# Patient Record
Sex: Female | Born: 1995 | Race: White | Hispanic: No | Marital: Single | Smoking: Never smoker
Health system: Southern US, Community
[De-identification: ages and names within clinical notes are randomized; demographics above are authoritative.]

## PROBLEM LIST (undated history)

## (undated) HISTORY — PX: MUSCLE BIOPSY: SHX716

---

## 2013-01-12 ENCOUNTER — Emergency Department: Payer: Self-pay | Admitting: Emergency Medicine

## 2013-01-12 LAB — CBC WITH DIFFERENTIAL/PLATELET
Eosinophil #: 0 10*3/uL (ref 0.0–0.7)
Lymphocyte #: 1.3 10*3/uL (ref 1.0–3.6)
Lymphocyte %: 9.3 %
MCV: 88 fL (ref 80–100)
Neutrophil %: 79.4 %
RDW: 12.1 % (ref 11.5–14.5)
WBC: 13.7 10*3/uL — ABNORMAL HIGH (ref 3.6–11.0)

## 2013-01-12 LAB — BASIC METABOLIC PANEL
Calcium, Total: 9.6 mg/dL (ref 9.0–10.7)
Chloride: 100 mmol/L (ref 98–107)
Co2: 27 mmol/L (ref 21–32)
EGFR (Non-African Amer.): >60
Glucose: 110 mg/dL — ABNORMAL HIGH (ref 65–99)
Potassium: 3.8 mmol/L (ref 3.5–5.1)

## 2013-01-12 LAB — MONONUCLEOSIS SCREEN: Mono Test: POSITIVE

## 2013-01-15 ENCOUNTER — Emergency Department: Payer: Self-pay | Admitting: Emergency Medicine

## 2013-01-15 LAB — CBC WITH DIFFERENTIAL/PLATELET
Eosinophil #: 0 10*3/uL (ref 0.0–0.7)
Eosinophil %: 0.2 %
HCT: 37.5 % (ref 35.0–47.0)
HGB: 13 g/dL (ref 12.0–16.0)
Lymphocyte #: 2 10*3/uL (ref 1.0–3.6)
Lymphocyte %: 24.1 %
MCHC: 34.6 g/dL (ref 32.0–36.0)
Monocyte #: 1.1 x10 3/mm — ABNORMAL HIGH (ref 0.2–0.9)
Monocyte %: 13.2 %
Neutrophil #: 5.3 10*3/uL (ref 1.4–6.5)
Neutrophil %: 62.3 %
Platelet: 168 10*3/uL (ref 150–440)
RDW: 12 % (ref 11.5–14.5)
WBC: 8.5 10*3/uL (ref 3.6–11.0)

## 2013-04-11 ENCOUNTER — Ambulatory Visit (INDEPENDENT_AMBULATORY_CARE_PROVIDER_SITE_OTHER): Payer: BC Managed Care – PPO | Admitting: Podiatry

## 2013-04-11 ENCOUNTER — Encounter: Payer: Self-pay | Admitting: Podiatry

## 2013-04-11 VITALS — BP 110/81 | HR 60 | Resp 16 | Ht 67.0 in | Wt 160.0 lb

## 2013-04-11 DIAGNOSIS — B351 Tinea unguium: Secondary | ICD-10-CM

## 2013-04-11 NOTE — Progress Notes (Signed)
   Subjective:    Patient ID: Toni Buchanan, female    DOB: 04-06-1995, 18 y.o.   MRN: [id number]  HPI Comments: Both great toeanails are discolored , and not sure if it is a fungus or stained from nail polish, sometimes at night i have to take the socks off my feet cause my toes are painful      Review of Systems  HENT:       Cough   All other systems reviewed and are negative.       Objective:   Physical Exam        Assessment & Plan:

## 2013-04-11 NOTE — Progress Notes (Signed)
Subjective:     Patient ID: Toni Buchanan, female   DOB: 08-23-1995, 18 y.o.   MRN: [id number]  HPI patient is found to have nail disease occurring after a pedicure and states her mother has had a history of fungus toenails and she is concerned   Review of Systems  All other systems reviewed and are negative.       Objective:   Physical Exam  Nursing note and vitals reviewed. Constitutional: She is oriented to person, place, and time.  Cardiovascular: Intact distal pulses.   Musculoskeletal: Normal range of motion.  Neurological: She is oriented to person, place, and time.  Skin: Skin is warm.   neurovascular status intact with no health history changes noted and nail disease mostly in the big toenails and slightly in the lesser nails of both feet     Assessment:     Possible mycotic nail infection versus trauma versus possible irritation from pedicure    Plan:     H&P done and today I recommended formulas 3 to try to reduce fungal element. Reappoint if symptoms continue to persist for consideration of oral therapy

## 2013-08-16 ENCOUNTER — Emergency Department: Payer: Self-pay | Admitting: Internal Medicine

## 2013-09-26 ENCOUNTER — Ambulatory Visit (INDEPENDENT_AMBULATORY_CARE_PROVIDER_SITE_OTHER): Payer: BC Managed Care – PPO | Admitting: Podiatry

## 2013-09-26 VITALS — BP 122/77 | HR 66 | Resp 16

## 2013-09-26 DIAGNOSIS — L600 Ingrowing nail: Secondary | ICD-10-CM

## 2013-09-26 DIAGNOSIS — B351 Tinea unguium: Secondary | ICD-10-CM

## 2013-09-26 DIAGNOSIS — Z79899 Other long term (current) drug therapy: Secondary | ICD-10-CM

## 2013-09-26 MED ORDER — TERBINAFINE HCL 250 MG PO TABS: 250.0000 mg | ORAL_TABLET | Freq: Every day | ORAL | Status: DC

## 2013-09-26 NOTE — Patient Instructions (Signed)

## 2013-09-27 LAB — HEPATIC FUNCTION PANEL
ALT: 10 IU/L (ref 0–32)
AST: 14 IU/L (ref 0–40)
Albumin: 4.6 g/dL (ref 3.5–5.5)
Alkaline Phosphatase: 66 IU/L (ref 39–117)
BILIRUBIN DIRECT: 0.09 mg/dL (ref 0.00–0.40)
Total Bilirubin: 0.3 mg/dL (ref 0.0–1.2)
Total Protein: 7.3 g/dL (ref 6.0–8.5)

## 2013-09-27 NOTE — Progress Notes (Signed)
Subjective:     Patient ID: Toni Buchanan, female   DOB: [birth year], 18 y.o.   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 Also today she is sent for blood work

## 2013-10-14 ENCOUNTER — Ambulatory Visit (INDEPENDENT_AMBULATORY_CARE_PROVIDER_SITE_OTHER): Payer: BC Managed Care – PPO | Admitting: Podiatry

## 2013-10-14 VITALS — BP 131/90 | HR 88 | Resp 16

## 2013-10-14 DIAGNOSIS — B351 Tinea unguium: Secondary | ICD-10-CM

## 2013-10-14 NOTE — Progress Notes (Signed)
Subjective:     Patient ID: Toni Buchanan, female   DOB: 11-07-1995, 18 y.o.   MRN: [id number]  HPI patient presents stating I was just concerned about my big toe and wanted it looked at   Review of Systems     Objective:   Physical Exam Neurovascular status intact with well-healing nail site right hallux with no drainage and crusting occur    Assessment:     Normal appearance of nail bed after removal    Plan:     Instructed on continuation of soaks bandage usage during day and allowing it to air dry a night with complete air drying within the next 5-7 days

## 2013-11-29 ENCOUNTER — Emergency Department: Payer: Self-pay | Admitting: Student

## 2013-12-26 ENCOUNTER — Ambulatory Visit: Payer: BC Managed Care – PPO | Admitting: Podiatry

## 2014-12-08 ENCOUNTER — Emergency Department: Payer: BLUE CROSS/BLUE SHIELD

## 2014-12-08 ENCOUNTER — Inpatient Hospital Stay: Payer: BLUE CROSS/BLUE SHIELD

## 2014-12-08 ENCOUNTER — Inpatient Hospital Stay
Admission: EM | Admit: 2014-12-08 | Discharge: 2014-12-12 | DRG: 493 | Disposition: A | Discharge status: Home / Self Care | Payer: BLUE CROSS/BLUE SHIELD | Attending: Orthopedic Surgery | Admitting: Orthopedic Surgery

## 2014-12-08 DIAGNOSIS — S83015A Lateral dislocation of left patella, initial encounter: Secondary | ICD-10-CM | POA: Diagnosis present

## 2014-12-08 DIAGNOSIS — Y999 Unspecified external cause status: Secondary | ICD-10-CM

## 2014-12-08 DIAGNOSIS — Y92214 College as the place of occurrence of the external cause: Secondary | ICD-10-CM

## 2014-12-08 DIAGNOSIS — S8252XA Displaced fracture of medial malleolus of left tibia, initial encounter for closed fracture: Secondary | ICD-10-CM

## 2014-12-08 DIAGNOSIS — W010XXA Fall on same level from slipping, tripping and stumbling without subsequent striking against object, initial encounter: Secondary | ICD-10-CM | POA: Diagnosis present

## 2014-12-08 DIAGNOSIS — N390 Urinary tract infection, site not specified: Secondary | ICD-10-CM | POA: Diagnosis present

## 2014-12-08 DIAGNOSIS — S82852A Displaced trimalleolar fracture of left lower leg, initial encounter for closed fracture: Secondary | ICD-10-CM | POA: Diagnosis present

## 2014-12-08 DIAGNOSIS — S82392A Other fracture of lower end of left tibia, initial encounter for closed fracture: Secondary | ICD-10-CM

## 2014-12-08 DIAGNOSIS — S82892A Other fracture of left lower leg, initial encounter for closed fracture: Secondary | ICD-10-CM | POA: Diagnosis present

## 2014-12-08 DIAGNOSIS — S82402A Unspecified fracture of shaft of left fibula, initial encounter for closed fracture: Secondary | ICD-10-CM

## 2014-12-08 LAB — URINALYSIS COMPLETE WITH MICROSCOPIC (ARMC ONLY)
Bilirubin Urine: NEGATIVE
GLUCOSE, UA: NEGATIVE mg/dL
NITRITE: NEGATIVE
PROTEIN: 30 mg/dL — AB
SPECIFIC GRAVITY, URINE: 1.027 (ref 1.005–1.030)
pH: 5 (ref 5.0–8.0)

## 2014-12-08 LAB — PREGNANCY, URINE: PREG TEST UR: NEGATIVE

## 2014-12-08 MED ORDER — MORPHINE SULFATE (PF) 2 MG/ML IV SOLN: 2.0000 mg | INTRAVENOUS | Status: DC | PRN

## 2014-12-08 MED ORDER — SODIUM CHLORIDE 0.9 % IV SOLN
INTRAVENOUS | Status: DC
  Administered 2014-12-08 – 2014-12-09 (×2): via INTRAVENOUS

## 2014-12-08 MED ORDER — KETOROLAC TROMETHAMINE 30 MG/ML IJ SOLN
30.0000 mg | Freq: Once | INTRAMUSCULAR | Status: AC
  Administered 2014-12-08: 30 mg via INTRAVENOUS
  Filled 2014-12-08: qty 1

## 2014-12-08 MED ORDER — SULFAMETHOXAZOLE-TRIMETHOPRIM 400-80 MG PO TABS
1.0000 | ORAL_TABLET | Freq: Two times a day (BID) | ORAL | Status: DC
  Administered 2014-12-08: 1 via ORAL
  Filled 2014-12-08 (×3): qty 1

## 2014-12-08 MED ORDER — OXYCODONE HCL 5 MG/5ML PO SOLN
5.0000 mg | ORAL | Status: DC | PRN
  Administered 2014-12-09: 5 mg via ORAL
  Filled 2014-12-08: qty 5

## 2014-12-08 MED ORDER — ACETAMINOPHEN 160 MG/5ML PO SOLN
500.0000 mg | ORAL | Status: DC | PRN
  Filled 2014-12-08 (×2): qty 20

## 2014-12-08 NOTE — H&P (Signed)
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 Patient states that she slipped on a wet floor in the bathroom. Her left patella, she reports dislocated laterally and then spontaneously relocated after several minutes. Patient has a prior history of patella dislocations. The last one was approximately a year ago. She also had pain in the left ankle after her fall. She was unable to stand or weight-bear on the left lower extremity after her injury. Patient denies other areas of injury. She denies hitting her head or losing consciousness.  History reviewed. No pertinent past medical history. Past Surgical History  Procedure Laterality Date  . Skin biopsy     Social History   Social History  . Marital Status: Single    Spouse Name: N/A  . Number of Children: N/A  . Years of Education: N/A   Social History Main Topics  . Smoking status: Never Smoker   . Smokeless tobacco: Never Used  . Alcohol Use: No  . Drug Use: No  . Sexual Activity: Not Asked   Other Topics Concern  . None   Social History Narrative   No family history on file. Allergies  Allergen Reactions  . 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    . Lamisil [Terbinafine] Rash   Prior to Admission medications   Not on File     Positive ROS: All other systems have been reviewed and were otherwise negative with the exception of those mentioned in the HPI and as above.  Physical Exam: General: Alert, no acute distress Cardiovascular: Regular rate and rhythm, no murmurs rubs or gallops.  No pedal edema Respiratory: Clear to auscultation bilaterally, no wheezes rales or rhonchi. No cyanosis, no use of accessory musculature GI: No organomegaly, abdomen  is soft and non-tender nondistended with positive bowel sounds. Skin: Skin intact, no lesions within the operative field. Neurologic: Sensation intact distally Psychiatric: Patient is competent for consent with normal mood and affect Lymphatic: No cervical lymphadenopathy  MUSCULOSKELETAL: Left knee: Patient's skin is intact. She has an effusion. There is no erythema or ecchymosis. Patient has her knee fully extended on the hospital stretcher. There is a small area of ecchymosis over the anterior knee. patient's thigh and leg compartments are soft and compressible.  Left ankle: Patient has a posterior splint in place. She has intact sensation to light touch in all 5 toes. Patient's toes are well-perfused. There is no obvious deformity seen.  Assessment: #1 dislocation of left patella #2 trimalleolar left ankle fracture, closed   Plan: I reviewed the patient's x-rays of both her left knee and her left ankle. The left knee demonstrates the patella is relocated. There is no acute fracture seen. X-rays of the left ankle reveal a trimalleolar ankle fracture. There is a comminuted fracture of the medial malleolus. There is a fracture with displacement of the lateral malleolus. There is slight lateral talar subluxation the posterior malleolar fragment is nondisplaced and involves less than 20% of the articular surface. I'm recommending open reduction internal fixation for her left trimalleolar ankle fracture. I splinted the details of the surgery to her. I also discussed the risks and benefits of surgery. The risks include but are not limited to infection, bleeding requiring blood transfusion,  nerve or blood vessel injury, ankle stiffness or loss of motion, persistent pain, osteoarthritis, weakness or instability, malunion, nonunion and hardware failure and the need for further surgery including hardware removal. Medical risks include but are not limited to DVT and pulmonary embolism, myocardial infarction,  stroke, pneumonia, respiratory failure and death. Patient understood these risks and wished to proceed. She is being admitted to the orthopedic surgery service. She'll be nothing by mouth after midnight. Labs have been ordered. She'll continue to keep the left lower extremity elevated with ice to help reduce swelling overnight. Patient has discussed this plan with her parents. I offered to call the parents but the patient states they are in agreement with the plan and do not need a phone call.   Juanell Fairly, MD   12/08/2014 7:38 PM

## 2014-12-08 NOTE — ED Notes (Signed)
Dr Martha Clan in with pt. Left ankle splinted with OCl and elevated .

## 2014-12-08 NOTE — ED Provider Notes (Signed)
[facility] Emergency Department Provider Note  ____________________________________________  Time seen: Approximately 4:50 PM  I have reviewed the triage vital signs and the nursing notes.   HISTORY  Chief Complaint Fall; Ankle Pain; and Knee Pain   HPI Toni Buchanan is a 19 y.o. female who presents for evaluation of left ankle pain status post fall prior to arrival. Patient states that she slipped on a wet floor at schools bathroom. She reports that her left knee popped out of place but popped [place] in while she was [facility].  History reviewed. No pertinent past medical history.  There are no active problems to display for this patient.   Past Surgical History  Procedure Laterality Date  . Skin biopsy      Current Outpatient Rx  Name  Route  Sig  Dispense  Refill  . OVER THE COUNTER MEDICATION      Formula 3 topical         . terbinafine (LAMISIL) 250 MG tablet   Oral   Take 1 tablet (250 mg total) by mouth daily.   90 tablet   0     Allergies Lamisil  No family history on file.  Social History Social History  Substance Use Topics  . Smoking status: Never Smoker   . Smokeless tobacco: Never Used  . Alcohol Use: No    Review of Systems Constitutional: No fever/chills Eyes: No visual changes. ENT: No sore throat. Cardiovascular: Denies chest pain. Respiratory: Denies shortness of breath. Gastrointestinal: No abdominal pain.  No nausea, no vomiting.  No diarrhea.  No constipation. Genitourinary: Negative for dysuria. Musculoskeletal: Positive for left ankle and knee pain. Skin: Negative for rash. Neurological: Negative for headaches, focal weakness or numbness.  10-point ROS otherwise negative.  ____________________________________________   PHYSICAL EXAM:  VITAL SIGNS: ED Triage Vitals  Enc Vitals Group     BP 12/08/14 1618 127/80 mmHg     Pulse Rate 12/08/14 1618 103     Resp 12/08/14  1618 18     Temp 12/08/14 1618 98 F (36.7 C)     Temp Source 12/08/14 1618 Oral     SpO2 12/08/14 1618 99 %     Weight 12/08/14 1618 180 lb (81.647 kg)     Height 12/08/14 1618  (1.727 m)     Head Cir --      Peak Flow --      Pain Score 12/08/14 1619 9     Pain Loc --      Pain Edu? --      Excl. in GC? --     Constitutional: Alert and oriented. Well appearing and in no acute distress. Cardiovascular: Normal rate, regular rhythm. Grossly normal heart sounds.  Good peripheral circulation. Respiratory: Normal respiratory effort.  No retractions. Lungs CTAB. Musculoskeletal: No lower extremity tenderness nor edema.  No joint effusions. Neurologic:  Normal speech and language. No gross focal neurologic deficits are appreciated. No gait instability. Skin:  Skin is warm, dry and intact. No rash noted. Psychiatric: Mood and affect are normal. Speech and behavior are normal.  ____________________________________________   LABS (all labs ordered are listed, but only abnormal results are displayed)  Labs Reviewed - No data to display  RADIOLOGY  FINDINGS: Moderately displaced oblique fracture is seen involving the distal left fibula. Mildly displaced and comminuted fracture is seen involving the medial malleolus. Mild lateral dislocation of the talus relative to distal tibia is  noted. Mildly displaced posterior malleolar fracture is noted as well. These fractures appear to be closed and posttraumatic.  IMPRESSION: Fractures are seen involving the distal left fibula, medial malleolus and posterior malleolus. Mild lateral dislocation of talus relative to distal tibia is noted. ____________________________________________   PROCEDURES  Procedure(s) performed: None  Critical Care performed: No  ____________________________________________   INITIAL IMPRESSION / ASSESSMENT AND PLAN / ED COURSE  Pertinent labs & imaging results that were available during my care of  the patient were reviewed by me and considered in my medical decision making (see chart for details).  Distal left fibula, posterior and medial malleolus fracture with dislocation of the talus. Spoke with Dr. Martha Clan, orthopedic on call. Patient to be admitted with surgery scheduled for tomorrow morning. ____________________________________________   FINAL CLINICAL IMPRESSION(S) / ED DIAGNOSES  Final diagnoses:  Fibula fracture, left, closed, initial encounter  Fracture, posterior malleolus, left, closed, initial encounter  Fractured medial malleolus, left, closed, initial encounter      Evangeline Dakin, PA-C 12/08/14 1813  Gayla Doss, MD 12/08/14 2033

## 2014-12-08 NOTE — ED Notes (Signed)
Pt states she slipped and fell injurying her left knee and ankle today.Marland Kitchen

## 2014-12-08 NOTE — Progress Notes (Signed)
Dr. Martha Clan notified of urinalysis results. Bactrim bid ordered. Urine culture ordered.

## 2014-12-09 ENCOUNTER — Inpatient Hospital Stay: Payer: BLUE CROSS/BLUE SHIELD

## 2014-12-09 ENCOUNTER — Inpatient Hospital Stay: Payer: BLUE CROSS/BLUE SHIELD | Admitting: Anesthesiology

## 2014-12-09 ENCOUNTER — Encounter
Admission: EM | Disposition: A | Discharge status: Home / Self Care | Payer: Self-pay | Source: Home / Self Care | Attending: Orthopedic Surgery

## 2014-12-09 HISTORY — PX: ORIF ANKLE FRACTURE: SHX5408

## 2014-12-09 LAB — TYPE AND SCREEN
ABO/RH(D): A NEG
ANTIBODY SCREEN: NEGATIVE

## 2014-12-09 LAB — BASIC METABOLIC PANEL
Anion gap: 4 — ABNORMAL LOW (ref 5–15)
BUN: 15 mg/dL (ref 6–20)
CHLORIDE: 109 mmol/L (ref 101–111)
CO2: 26 mmol/L (ref 22–32)
CREATININE: 0.75 mg/dL (ref 0.44–1.00)
Calcium: 8.6 mg/dL — ABNORMAL LOW (ref 8.9–10.3)
GFR calc non Af Amer: >60 mL/min (ref >60)
GLUCOSE: 97 mg/dL (ref 65–99)
Potassium: 3.6 mmol/L (ref 3.5–5.1)
Sodium: 139 mmol/L (ref 135–145)

## 2014-12-09 LAB — CBC
HEMATOCRIT: 33.5 % — AB (ref 35.0–47.0)
HEMOGLOBIN: 11.5 g/dL — AB (ref 12.0–16.0)
MCH: 30.3 pg (ref 26.0–34.0)
MCHC: 34.4 g/dL (ref 32.0–36.0)
MCV: 88 fL (ref 80.0–100.0)
Platelets: 184 10*3/uL (ref 150–440)
RBC: 3.81 MIL/uL (ref 3.80–5.20)
RDW: 12.4 % (ref 11.5–14.5)
WBC: 5.1 10*3/uL (ref 3.6–11.0)

## 2014-12-09 LAB — MRSA PCR SCREENING: MRSA by PCR: NEGATIVE

## 2014-12-09 LAB — ABO/RH: ABO/RH(D): A NEG

## 2014-12-09 SURGERY — OPEN REDUCTION INTERNAL FIXATION (ORIF) ANKLE FRACTURE
Anesthesia: General | Laterality: Left | Wound class: Clean

## 2014-12-09 MED ORDER — FENTANYL CITRATE (PF) 100 MCG/2ML IJ SOLN: 25.0000 ug | INTRAMUSCULAR | Status: DC | PRN

## 2014-12-09 MED ORDER — LACTATED RINGERS IV SOLN
INTRAVENOUS | Status: DC | PRN
  Administered 2014-12-09: 14:00:00 via INTRAVENOUS

## 2014-12-09 MED ORDER — ACETAMINOPHEN 650 MG RE SUPP: 650.0000 mg | Freq: Four times a day (QID) | RECTAL | Status: DC | PRN

## 2014-12-09 MED ORDER — FENTANYL CITRATE (PF) 100 MCG/2ML IJ SOLN
INTRAMUSCULAR | Status: DC | PRN
  Administered 2014-12-09 (×2): 50 ug via INTRAVENOUS

## 2014-12-09 MED ORDER — MAGNESIUM CITRATE PO SOLN: 1.0000 | Freq: Once | ORAL | Status: DC | PRN

## 2014-12-09 MED ORDER — ACETAMINOPHEN 325 MG PO TABS: 650.0000 mg | ORAL_TABLET | Freq: Four times a day (QID) | ORAL | Status: DC | PRN

## 2014-12-09 MED ORDER — BUPIVACAINE HCL (PF) 0.5 % IJ SOLN
INTRAMUSCULAR | Status: DC | PRN
  Administered 2014-12-09: 3 mL

## 2014-12-09 MED ORDER — CEFAZOLIN SODIUM-DEXTROSE 2-3 GM-% IV SOLR
INTRAVENOUS | Status: DC | PRN
  Administered 2014-12-09: 2 g via INTRAVENOUS

## 2014-12-09 MED ORDER — PROPOFOL 500 MG/50ML IV EMUL
INTRAVENOUS | Status: DC | PRN
  Administered 2014-12-09: 75 ug/kg/min via INTRAVENOUS

## 2014-12-09 MED ORDER — ONDANSETRON HCL 4 MG PO TABS: 4.0000 mg | ORAL_TABLET | Freq: Four times a day (QID) | ORAL | Status: DC | PRN

## 2014-12-09 MED ORDER — BISACODYL 10 MG RE SUPP: 10.0000 mg | Freq: Every day | RECTAL | Status: DC | PRN

## 2014-12-09 MED ORDER — MIDAZOLAM HCL 5 MG/5ML IJ SOLN
INTRAMUSCULAR | Status: DC | PRN
  Administered 2014-12-09 (×4): 1 mg via INTRAVENOUS

## 2014-12-09 MED ORDER — ONDANSETRON HCL 4 MG/2ML IJ SOLN: 4.0000 mg | Freq: Four times a day (QID) | INTRAMUSCULAR | Status: DC | PRN

## 2014-12-09 MED ORDER — SODIUM CHLORIDE 0.9 % IV SOLN
INTRAVENOUS | Status: DC
  Administered 2014-12-09 – 2014-12-10 (×2): via INTRAVENOUS

## 2014-12-09 MED ORDER — BUPIVACAINE HCL 0.25 % IJ SOLN
INTRAMUSCULAR | Status: DC | PRN
  Administered 2014-12-09: 30 mL

## 2014-12-09 MED ORDER — CEFAZOLIN SODIUM 1-5 GM-% IV SOLN
1.0000 g | Freq: Four times a day (QID) | INTRAVENOUS | Status: AC
  Administered 2014-12-09 – 2014-12-10 (×3): 1 g via INTRAVENOUS
  Filled 2014-12-09 (×3): qty 50

## 2014-12-09 MED ORDER — ONDANSETRON HCL 4 MG/2ML IJ SOLN
INTRAMUSCULAR | Status: DC | PRN
  Administered 2014-12-09: 4 mg via INTRAVENOUS

## 2014-12-09 MED ORDER — ENOXAPARIN SODIUM 30 MG/0.3ML ~~LOC~~ SOLN
30.0000 mg | Freq: Two times a day (BID) | SUBCUTANEOUS | Status: DC
  Administered 2014-12-10 – 2014-12-12 (×5): 30 mg via SUBCUTANEOUS
  Filled 2014-12-09 (×5): qty 0.3

## 2014-12-09 MED ORDER — ACETAMINOPHEN 160 MG/5ML PO SOLN
500.0000 mg | ORAL | Status: DC | PRN
  Filled 2014-12-09: qty 20

## 2014-12-09 MED ORDER — NEOMYCIN-POLYMYXIN B GU 40-200000 IR SOLN
Status: DC | PRN
Start: 2014-12-09 — End: 2014-12-09
  Administered 2014-12-09: 4 mL

## 2014-12-09 MED ORDER — MORPHINE SULFATE (PF) 2 MG/ML IV SOLN
2.0000 mg | INTRAVENOUS | Status: DC | PRN
  Administered 2014-12-09 – 2014-12-10 (×2): 2 mg via INTRAVENOUS
  Filled 2014-12-09 (×2): qty 1

## 2014-12-09 MED ORDER — MAGNESIUM HYDROXIDE 400 MG/5ML PO SUSP
30.0000 mL | Freq: Every day | ORAL | Status: DC | PRN
  Administered 2014-12-11: 30 mL via ORAL
  Filled 2014-12-09: qty 30

## 2014-12-09 MED ORDER — LIDOCAINE HCL (CARDIAC) 20 MG/ML IV SOLN
INTRAVENOUS | Status: DC | PRN
  Administered 2014-12-09: 30 mg via INTRAVENOUS

## 2014-12-09 MED ORDER — OXYCODONE HCL 5 MG/5ML PO SOLN
10.0000 mg | ORAL | Status: DC | PRN
  Administered 2014-12-09 – 2014-12-12 (×10): 10 mg via ORAL
  Filled 2014-12-09 (×11): qty 10

## 2014-12-09 MED ORDER — ONDANSETRON HCL 4 MG/2ML IJ SOLN: 4.0000 mg | Freq: Once | INTRAMUSCULAR | Status: DC | PRN

## 2014-12-09 MED ORDER — SULFAMETHOXAZOLE-TRIMETHOPRIM 400-80 MG PO TABS
1.0000 | ORAL_TABLET | Freq: Two times a day (BID) | ORAL | Status: DC
  Administered 2014-12-09 – 2014-12-12 (×6): 1 via ORAL
  Filled 2014-12-09 (×6): qty 1

## 2014-12-09 SURGICAL SUPPLY — 56 items
BANDAGE ELASTIC 4 CLIP NS LF (GAUZE/BANDAGES/DRESSINGS) ×6 IMPLANT
BIT DRILL 2.5 X LONG (BIT) ×1
BIT DRILL LONG 2.7 (BIT) ×1 IMPLANT
BIT DRILL X LONG 2.5 (BIT) ×1 IMPLANT
BLADE SURG 15 STRL LF DISP TIS (BLADE) ×1 IMPLANT
BLADE SURG 15 STRL SS (BLADE) ×2
BNDG ESMARK 6X12 TAN STRL LF (GAUZE/BANDAGES/DRESSINGS) ×3 IMPLANT
CLOSURE WOUND 1/2 X4 (GAUZE/BANDAGES/DRESSINGS) ×2
DRAPE FLUOR MINI C-ARM 54X84 (DRAPES) ×3 IMPLANT
DRAPE INCISE IOBAN 66X45 STRL (DRAPES) ×3 IMPLANT
DRAPE U-SHAPE 47X51 STRL (DRAPES) ×3 IMPLANT
DRILL BIT LONG 2.7 (BIT) ×3
DRILL BIT X LONG 2.5 (BIT) ×2
DURAPREP 26ML APPLICATOR (WOUND CARE) ×6 IMPLANT
GAUZE PETRO XEROFOAM 1X8 (MISCELLANEOUS) ×3 IMPLANT
GAUZE SPONGE 4X4 12PLY STRL (GAUZE/BANDAGES/DRESSINGS) ×3 IMPLANT
GLOVE BIOGEL PI IND STRL 9 (GLOVE) ×1 IMPLANT
GLOVE BIOGEL PI INDICATOR 9 (GLOVE) ×2
GLOVE SURG 9.0 ORTHO LTXF (GLOVE) ×6 IMPLANT
GOWN STRL REUS TWL 2XL XL LVL4 (GOWN DISPOSABLE) ×3 IMPLANT
GOWN STRL REUS W/ TWL LRG LVL3 (GOWN DISPOSABLE) ×1 IMPLANT
GOWN STRL REUS W/TWL LRG LVL3 (GOWN DISPOSABLE) ×2
GUIDEWIRE THREADED 1.1X150MM (WIRE) ×9 IMPLANT
KIT RM TURNOVER STRD PROC AR (KITS) ×3 IMPLANT
LABEL OR SOLS (LABEL) IMPLANT
NS IRRIG 1000ML POUR BTL (IV SOLUTION) ×3 IMPLANT
PACK EXTREMITY ARMC (MISCELLANEOUS) ×3 IMPLANT
PAD ABD DERMACEA PRESS 5X9 (GAUZE/BANDAGES/DRESSINGS) IMPLANT
PAD CAST CTTN 4X4 STRL (SOFTGOODS) ×3 IMPLANT
PADDING CAST COTTON 4X4 STRL (SOFTGOODS) ×6
PLATE LCP 3.5 1/3 TUB 10HX117 (Plate) ×3 IMPLANT
SCREW CANC FT ST SFS 4X16 (Screw) ×6 IMPLANT
SCREW CANC FT/18 4.0 (Screw) ×6 IMPLANT
SCREW CANN L THRD/40 4.0 (Screw) ×6 IMPLANT
SCREW CORTEX 3.5 12MM (Screw) ×4 IMPLANT
SCREW CORTEX 3.5 14MM (Screw) ×4 IMPLANT
SCREW CORTEX 3.5 16MM (Screw) ×2 IMPLANT
SCREW CORTEX 3.5 18MM (Screw) ×2 IMPLANT
SCREW CORTEX 3.5 20MM (Screw) IMPLANT
SCREW LOCK CORT ST 3.5X12 (Screw) ×2 IMPLANT
SCREW LOCK CORT ST 3.5X14 (Screw) ×2 IMPLANT
SCREW LOCK CORT ST 3.5X16 (Screw) ×1 IMPLANT
SCREW LOCK CORT ST 3.5X18 (Screw) ×1 IMPLANT
SCREW LOCK CORT ST 3.5X20 (Screw) IMPLANT
SPLINT CAST 1 STEP 4X30 (MISCELLANEOUS) ×6 IMPLANT
SPONGE LAP 18X18 5 PK (GAUZE/BANDAGES/DRESSINGS) ×3 IMPLANT
STAPLER SKIN PROX 35W (STAPLE) ×3 IMPLANT
STOCKINETTE STRL 6IN 960660 (GAUZE/BANDAGES/DRESSINGS) ×3 IMPLANT
STRIP CLOSURE SKIN 1/2X4 (GAUZE/BANDAGES/DRESSINGS) ×4 IMPLANT
SUT VIC AB 0 CT2 27 (SUTURE) ×6 IMPLANT
SUT VIC AB 2-0 SH 27 (SUTURE) ×4
SUT VIC AB 2-0 SH 27XBRD (SUTURE) ×2 IMPLANT
SUT VIC AB 3-0 SH 27 (SUTURE) ×4
SUT VIC AB 3-0 SH 27X BRD (SUTURE) ×2 IMPLANT
SYR 30ML LL (SYRINGE) IMPLANT
TAPE MICROPORE 2IN (TAPE) IMPLANT

## 2014-12-09 NOTE — Progress Notes (Signed)
Subjective:  Patient reports pain as mild.  Patient is resting comfortably in bed. She has no complaints. She asked me to speak with her father by phone.  Objective:   VITALS:   Filed Vitals:   12/08/14 1618 12/08/14 2041 12/09/14 0509 12/09/14 0812  BP: 127/80 124/71 116/58 127/74  Pulse: 103 90 95 96  Temp: 98 F (36.7 C) 98 F (36.7 C) 98.3 F (36.8 C) 98.1 F (36.7 C)  TempSrc: Oral Oral Oral Oral  Resp: 18 18 18 18   Height: 5\' 8"  (1.727 m) 5\' 8"  (1.727 m)    Weight: 81.647 kg (180 lb) 83.598 kg (184 lb 4.8 oz)    SpO2: 99% 100% 99% 100%    PHYSICAL EXAM: Left lower extremity: Patient has intact sensation light touch in all 5 toes. Her toes are well-perfused. She is no evidence of compartment syndrome.   LABS  Results for orders placed or performed during the hospital encounter of 12/08/14 (from the past 24 hour(s))  Urinalysis complete, with microscopic ([facility] only)     Status: Abnormal   Collection Time: 12/08/14  7:54 PM  Result Value Ref Range   Color, Urine AMBER (A) YELLOW   APPearance CLOUDY (A) CLEAR   Glucose, UA NEGATIVE NEGATIVE mg/dL   Bilirubin Urine NEGATIVE NEGATIVE   Ketones, ur 1+ (A) NEGATIVE mg/dL   Specific Gravity, Urine 1.027 1.005 - 1.030   Hgb urine dipstick 1+ (A) NEGATIVE   pH 5.0 5.0 - 8.0   Protein, ur 30 (A) NEGATIVE mg/dL   Nitrite NEGATIVE NEGATIVE   Leukocytes, UA 3+ (A) NEGATIVE   RBC / HPF 0-5 0 - 5 RBC/hpf   WBC, UA TOO NUMEROUS TO COUNT 0 - 5 WBC/hpf   Bacteria, UA RARE (A) NONE SEEN   Squamous Epithelial / LPF 6-30 (A) NONE SEEN   Mucous PRESENT   Pregnancy, urine     Status: None   Collection Time: 12/08/14  7:54 PM  Result Value Ref Range   Preg Test, Ur NEGATIVE NEGATIVE  CBC     Status: Abnormal   Collection Time: 12/09/14  6:24 AM  Result Value Ref Range   WBC 5.1 3.6 - 11.0 K/uL   RBC 3.81 3.80 - 5.20 MIL/uL   Hemoglobin 11.5 (L) 12.0 - 16.0 g/dL   HCT 13.0 (L) 86.5 - 78.4 %   MCV 88.0 80.0 - 100.0 fL   MCH  30.3 26.0 - 34.0 pg   MCHC 34.4 32.0 - 36.0 g/dL   RDW 69.6 29.5 - 28.4 %   Platelets 184 150 - 440 K/uL  Basic metabolic panel     Status: Abnormal   Collection Time: 12/09/14  6:24 AM  Result Value Ref Range   Sodium 139 135 - 145 mmol/L   Potassium 3.6 3.5 - 5.1 mmol/L   Chloride 109 101 - 111 mmol/L   CO2 26 22 - 32 mmol/L   Glucose, Bld 97 65 - 99 mg/dL   BUN 15 6 - 20 mg/dL   Creatinine, Ser 1.32 0.44 - 1.00 mg/dL   Calcium 8.6 (L) 8.9 - 10.3 mg/dL   GFR calc non Af Amer >60 >60 mL/min   GFR calc Af Amer >60 >60 mL/min   Anion gap 4 (L) 5 - 15    Dg Ankle Complete Left  12/08/2014   CLINICAL DATA:  Acute left ankle pain and swelling after falling in bathroom. Initial encounter.  EXAM: LEFT ANKLE COMPLETE - 3+ VIEW  COMPARISON:  None.  FINDINGS: Moderately displaced oblique fracture is seen involving the distal left fibula. Mildly displaced and comminuted fracture is seen involving the medial malleolus. Mild lateral dislocation of the talus relative to distal tibia is noted. Mildly displaced posterior malleolar fracture is noted as well. These fractures appear to be closed and posttraumatic.  IMPRESSION: Fractures are seen involving the distal left fibula, medial malleolus and posterior malleolus. Mild lateral dislocation of talus relative to distal tibia is noted.   Electronically Signed   By: Lupita Raider, M.D.   On: 12/08/2014 17:09   Ct Ankle Left Wo Contrast  12/08/2014   CLINICAL DATA:  Status post fall. Lateral dislocation of the patella. Unable to stand after injury. Initial encounter.  EXAM: CT OF THE LEFT ANKLE WITHOUT CONTRAST  CT 3D CT IMAGE RENDERING ON ACQUISITION WORKSTATION  TECHNIQUE: Multidetector CT imaging of the left ankle was performed according to the standard protocol. Multiplanar CT image reconstructions were also generated.  COMPARISON:  Left knee radiographs performed earlier today at 4:50 p.m.  FINDINGS: There is a comminuted trimalleolar fracture, with  mild lateral displacement of the medial malleolar fragment, an oblique fracture through the distal fibular diaphysis, and a minimally displaced posterior malleolar fracture. There is slight medial widening of the ankle mortise, with slight lateral talar tilt.  No additional fractures are seen. The subtalar joint is unremarkable in appearance. The sinus RC is unremarkable. Visualized joint spaces are otherwise preserved.  Mild soft tissue injury is noted about the ankle, adjacent to visualized fractures. The flexor and extensor tendons are grossly unremarkable. The peroneal tendons are within normal limits. The vasculature is not well assessed without contrast. The Achilles tendon remains intact.  3-dimensional CT images were rendered by post-processing of the original CT data at the CT scanner. The 3-dimensional CT images were interpreted, and findings were reported in the accompanying complete CT report for this study.  IMPRESSION: 1. Comminuted trimalleolar fracture, with mild lateral displacement of the medial malleolar fragment, an oblique fracture through the distal fibular diaphysis, and a minimally displaced posterior malleolar fracture. Slight medial widening of the ankle mortise, with slight lateral talar tilt. 2. Mild soft tissue injury noted about the ankle.   Electronically Signed   By: Roanna Raider M.D.   On: 12/08/2014 21:27   Ct 3d Recon At Scanner  12/08/2014   CLINICAL DATA:  Status post fall. Lateral dislocation of the patella. Unable to stand after injury. Initial encounter.  EXAM: CT OF THE LEFT ANKLE WITHOUT CONTRAST  CT 3D CT IMAGE RENDERING ON ACQUISITION WORKSTATION  TECHNIQUE: Multidetector CT imaging of the left ankle was performed according to the standard protocol. Multiplanar CT image reconstructions were also generated.  COMPARISON:  Left knee radiographs performed earlier today at 4:50 p.m.  FINDINGS: There is a comminuted trimalleolar fracture, with mild lateral displacement of  the medial malleolar fragment, an oblique fracture through the distal fibular diaphysis, and a minimally displaced posterior malleolar fracture. There is slight medial widening of the ankle mortise, with slight lateral talar tilt.  No additional fractures are seen. The subtalar joint is unremarkable in appearance. The sinus RC is unremarkable. Visualized joint spaces are otherwise preserved.  Mild soft tissue injury is noted about the ankle, adjacent to visualized fractures. The flexor and extensor tendons are grossly unremarkable. The peroneal tendons are within normal limits. The vasculature is not well assessed without contrast. The Achilles tendon remains intact.  3-dimensional CT images were rendered by post-processing of the  original CT data at the CT scanner. The 3-dimensional CT images were interpreted, and findings were reported in the accompanying complete CT report for this study.  IMPRESSION: 1. Comminuted trimalleolar fracture, with mild lateral displacement of the medial malleolar fragment, an oblique fracture through the distal fibular diaphysis, and a minimally displaced posterior malleolar fracture. Slight medial widening of the ankle mortise, with slight lateral talar tilt. 2. Mild soft tissue injury noted about the ankle.   Electronically Signed   By: Roanna Raider M.D.   On: 12/08/2014 21:27   Dg Knee Complete 4 Views Left  12/08/2014   CLINICAL DATA:  Fall on floor hand bathroom with left knee pain and swelling. Initial encounter.  EXAM: LEFT KNEE - COMPLETE 4+ VIEW  COMPARISON:  Prior CT and radiographs of the left knee dated 08/16/2013  FINDINGS: No evidence of acute fracture or dislocation. No significant joint effusion identified. There is suggestion of lateral joint space narrowing.  IMPRESSION: No acute fracture.   Electronically Signed   By: Irish Lack M.D.   On: 12/08/2014 17:11    Assessment/Plan:     Active Problems:   Trimalleolar fracture of left ankle  Patient  doing well. She continues to elevate her left lower extremity. I spoke with the patient's father by phone. I explained him the details of the surgery. He is coming down to speak with [facility].  He is going to work out logistics of having her on campus and continuing to go to class despite her recent injury. I explained to him that Giulianna will be in the hospital for 1-2 days postop and will work with physical therapy. We'll have a better assessment of her ability to ambulate with crutches or a walker after PT assessment. Her father is making his way down to [place] from [place]. Her sister is coming in this afternoon from [place]. I answered all questions by the patient and her father. She will remain nothing by mouth and her surgery is scheduled for this afternoon.    Juanell Fairly , MD 12/09/2014, 12:13 PM

## 2014-12-09 NOTE — Op Note (Signed)
12/08/2014 - 12/09/2014  5:21 PM  PATIENT:  Toni Buchanan    PRE-OPERATIVE DIAGNOSIS:  ANKLE FRACTURE LEFT  POST-OPERATIVE DIAGNOSIS:  Same  PROCEDURE:  OPEN REDUCTION INTERNAL FIXATION (ORIF) ANKLE FRACTURE  SURGEON:  Thornton Park, MD  ANESTHESIA:   General  PREOPERATIVE INDICATIONS:  Toni Buchanan is a  19 y.o. female with a diagnosis of LEFT TRIAMALLEOLAR ANKLE FRACTURE.  Based on the patient's fracture displacement I recommended open reduction internal fixation.    I discussed the risks and benefits of surgery with the patient. She understands the risks include but are not limited to infection, bleeding requiring blood transfusion, nerve or blood vessel injury, joint stiffness or loss of motion, persistent pain, weakness or instability, osteoarthritis, malunion, nonunion and hardware failure, painful hardware and the need for further surgery. Medical risks include but are not limited to DVT and pulmonary embolism, myocardial infarction, stroke, pneumonia, respiratory failure and death. Patient understood these risks and wished to proceed.   OPERATIVE IMPLANTS: Synthes one third tubular 10 hole plate for lateral malleolus fixation and two 4.0 cannulated screws for medial malleolus fixation  OPERATIVE FINDINGS: Closed trimalleolar left ankle fracture with displacement of the medial and lateral malleoli.  OPERATIVE PROCEDURE:   Patient was met in the preoperative area. Her left leg was marked with my initials and the word "yes" according the hospital's correct site of surgery protocol. She was brought to the operating room where she underwent spinal anesthesia as the patient was at risk for malignant hyperthermia. She was placed supine on the operative table.  A tourniquet was applied to her left thigh. Her lower extremities were prepped and draped in a sterile fashion. A timeout was performed to verify the patient's name, date of birth, medical record number, correct site of surgery and  correct procedure to be performed. The timeout was also used to verify the patient received antibiotics appropriate instruments, implants and radiographic studies were available in the room. Once all in attendance were in agreement case began. Patient was seen 2 g of capsule prior to the onset of the case.  The left lower extremity was exsanguinated with an Esmarch. The tourniquet was up inflated to 275 mmHg. This was applied for a total of 119 minutes. A lateral incision was made over the fibula. The subcutaneous tissues were dissected with the Metzenbaum scissor and pickup. Careful was taken to avoid injury to the superficial peroneal nerve. The fracture was then identified. It was irrigated and fracture hematoma was removed. A fracture reduction clamp was used to reduce the fracture to an anatomic position. A single lag screw placed perpendicular across the fracture site was then inserted. This is a 18 mm bicortical screw. A 10 hole one third tubular plate was then contoured and placed along the lateral fibula. Bicortical screws were placed above the fracture and a single fully threaded cancellus and a single locking screw were placed below the fracture. The fracture reduction and hardware placement were confirmed on AP and lateral imaging.  Once the lateral malleolus was plated, the attention was turned to the medial ankle. A small vertical incision was made over the tip of the medial malleolus. 2 threaded K wires for the 4.0 cannulated screws were advanced through the tip of the medial malleolus across the fracture site and into the distal tibia. Position of these K wires were confirmed using AP and lateral FluoroScan imaging. Once the K wires were in adequate position they were measured with a depth gauge and  overdrilled with a cannulated drill. Both screws were measured to be 40 mm in length. These long threaded 40 mm 40 cannulated screws were then advanced into position and tightened by hand.  The  posterior malleolus was then examined under fluoroscopy. It was 20% of the articular surface and was in an anatomic position. The decision was made made not to place an AP screw given its stability and small size.  The syndesmosis was tested by performing a stress test on the left ankle after fixation of the medial and lateral malleoli. Patient demonstrated no diastases between the fibula and tibia and no increase in medial clear space.  The medial and lateral incisions were then copiously irrigated. The subcutaneous tissue was closed 20 Vicryls and the skin approximated staples. A dry sterile dressing was applied along with an AO splint. The patient's ankle was splinted in a neutral position. She was then awoken from anesthesia and transferred to hospital bed and brought to the PACU in stable condition. I scrubbed and present the entire case and all sharp and instrument counts were correct at conclusion the case. I spoke to the patient's sister in the postop consultation room to let her know the case was performed without complication the patient was stable in recovery room.    Timoteo Gaul, MD

## 2014-12-09 NOTE — Anesthesia Preprocedure Evaluation (Addendum)
Anesthesia Evaluation  Patient identified by MRN, date of birth, ID band Patient awake    Reviewed: Allergy & Precautions, NPO status , Patient's Chart, lab work & pertinent test results  History of Anesthesia Complications (+) history of anesthetic complications (unspecified muscle weakness, ?MH risk)  Airway Mallampati: II  TM Distance: >3 FB     Dental  (+) Teeth Intact   Pulmonary neg pulmonary ROS,           Cardiovascular negative cardio ROS       Neuro/Psych  Neuromuscular disease (unspecified muscle weakness)    GI/Hepatic negative GI ROS, Neg liver ROS,   Endo/Other  negative endocrine ROS  Renal/GU negative Renal ROS     Musculoskeletal   Abdominal   Peds  Hematology negative hematology ROS (+)   Anesthesia Other Findings   Reproductive/Obstetrics                            Anesthesia Physical Anesthesia Plan  ASA: II  Anesthesia Plan: General   Post-op Pain Management:    Induction: Intravenous  Airway Management Planned: Nasal Cannula  Additional Equipment:   Intra-op Plan:   Post-operative Plan:   Informed Consent: I have reviewed the patients History and Physical, chart, labs and discussed the procedure including the risks, benefits and alternatives for the proposed anesthesia with the patient or authorized representative who has indicated his/her understanding and acceptance.     Plan Discussed with:   Anesthesia Plan Comments:         Anesthesia Quick Evaluation

## 2014-12-09 NOTE — Progress Notes (Signed)
mrsa pcr SENT

## 2014-12-09 NOTE — Transfer of Care (Signed)
Immediate Anesthesia Transfer of Care Note  Patient: Toni Buchanan  Procedure(s) Performed: Procedure(s): OPEN REDUCTION INTERNAL FIXATION (ORIF) ANKLE FRACTURE (Left)  Patient Location: PACU  Anesthesia Type:Spinal  Level of Consciousness: awake, alert  and oriented  Airway & Oxygen Therapy: Patient Spontanous Breathing and Patient connected to face mask oxygen  Post-op Assessment: Post -op Vital signs reviewed and stable  Post vital signs: Reviewed and stable  Last Vitals:  Filed Vitals:   12/09/14 1709  BP: 103/55  Pulse: 97  Temp: 36.9 C  Resp: 10    Complications: No apparent anesthesia complications

## 2014-12-09 NOTE — Care Management Note (Signed)
Case Management Note  Patient Details  Name: Toni Buchanan MRN: [id number] Date of Birth: 04-25-1995  Subjective/Objective:                  Met with patient to discuss discharge planning. 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 She plans for surgery today to repair fracture. She has no DME available as she was independent with mobility prior to this injury. She states her sister will be coming to stay with her as [facility] will allow her to stay on campus with patient- [name]. She uses [facility] on [address] for Rx [phone].  Action/Plan: List of home health agencies left with patient. Rolling walker requested and delivered by Will with [facility]. Rx needed for walker. RNCM will continue to follow.   Expected Discharge Date:                  Expected Discharge Plan:     In-House Referral:     Discharge planning Services  CM Consult  Post Acute Care Choice:  Durable Medical Equipment, Home Health Choice offered to:  Patient  DME Arranged:  Walker rolling DME Agency:  [place]:    [facility] Agency:     Status of Service:  In process, will continue to follow  Medicare Important Message Given:    Date Medicare IM Given:    Medicare IM give by:    Date Additional Medicare IM Given:    Additional Medicare Important Message give by:     If discussed at [place] of Stay Meetings, dates discussed:    Additional Comments:  Marshell Garfinkel, RN 12/09/2014, 11:01 AM

## 2014-12-09 NOTE — OR Nursing (Signed)
Patient came [place] to OR with personal cell phone. She stated her sister would be arriving shortly and I let her know that when she arrived i would hand off her phone to her. Patient and MD both aware. Cell phone given to sister just before case started.

## 2014-12-09 NOTE — Anesthesia Procedure Notes (Signed)
Spinal Patient location during procedure: OR Start time: 12/09/2014 2:15 PM End time: 12/09/2014 2:36 PM Preanesthetic Checklist Completed: patient identified, site marked, surgical consent, pre-op evaluation, timeout performed, IV checked, risks and benefits discussed and monitors and equipment checked Spinal Block Patient position: sitting Prep: Betadine Patient monitoring: heart rate, continuous pulse ox, blood pressure and cardiac monitor Approach: midline Location: L4-5 Injection technique: single-shot Needle Needle type: Whitacre and Introducer  Needle gauge: 25 G Needle length: 9 cm Needle insertion depth: 5 cm Assessment Sensory level: T8 Additional Notes Negative paresthesia. Negative blood return. Positive free-flowing CSF. Expiration date of kit checked and confirmed. Patient tolerated procedure well, without complications.

## 2014-12-10 ENCOUNTER — Encounter: Payer: Self-pay | Admitting: Orthopedic Surgery

## 2014-12-10 NOTE — Anesthesia Postprocedure Evaluation (Signed)
  Anesthesia Post-op Note  Patient: Toni Buchanan  Procedure(s) Performed: Procedure(s): OPEN REDUCTION INTERNAL FIXATION (ORIF) ANKLE FRACTURE (Left)  Anesthesia type:General  Patient location: PACU  Post pain: Pain level controlled  Post assessment: Post-op Vital signs reviewed, Patient's Cardiovascular Status Stable, Respiratory Function Stable, Patent Airway and No signs of Nausea or vomiting  Post vital signs: Reviewed and stable  Last Vitals:  Filed Vitals:   12/10/14 0818  BP: 122/76  Pulse: 85  Temp: 36.7 C  Resp: 18    Level of consciousness: awake, alert  and patient cooperative  Complications: No apparent anesthesia complications

## 2014-12-10 NOTE — Progress Notes (Signed)
Subjective:  Postop day #1 status post open reduction internal fixation for left trimalleolar ankle fracture. Patient reports pain as moderate.  Patient is seen with her father at the bedside. Patient is up out of bed to a chair with her left leg elevated and ice applied. Patient states that she is reasonably comfortable at the moment. She has been taking oxycodone and morphine for pain.  Objective:   VITALS:   Filed Vitals:   12/09/14 2235 12/10/14 0108 12/10/14 0348 12/10/14 0818  BP: 132/76 135/71 123/65 122/76  Pulse: 102 94 81 85  Temp: 98.9 F (37.2 C) 98.6 F (37 C) 99.1 F (37.3 C) 98.1 F (36.7 C)  TempSrc: Oral Oral Oral Oral  Resp: 18 18 18 18   Height:      Weight:      SpO2: 100% 100% 99% 100%    PHYSICAL EXAM: Left lower extremity: Patient's splint is clean dry and intact. Her toes are well-perfused. She can flex and extend her toes actively without pain. She has no pain with passive stretch. She has intact sensation light touch throughout all toes on the left foot.   LABS  No results found for this or any previous visit (from the past 24 hour(s)).  Dg Ankle Complete Left  12/08/2014   CLINICAL DATA:  Acute left ankle pain and swelling after falling in bathroom. Initial encounter.  EXAM: LEFT ANKLE COMPLETE - 3+ VIEW  COMPARISON:  None.  FINDINGS: Moderately displaced oblique fracture is seen involving the distal left fibula. Mildly displaced and comminuted fracture is seen involving the medial malleolus. Mild lateral dislocation of the talus relative to distal tibia is noted. Mildly displaced posterior malleolar fracture is noted as well. These fractures appear to be closed and posttraumatic.  IMPRESSION: Fractures are seen involving the distal left fibula, medial malleolus and posterior malleolus. Mild lateral dislocation of talus relative to distal tibia is noted.   Electronically Signed   By: Lupita Raider, M.D.   On: 12/08/2014 17:09   Ct Ankle Left Wo  Contrast  12/08/2014   CLINICAL DATA:  Status post fall. Lateral dislocation of the patella. Unable to stand after injury. Initial encounter.  EXAM: CT OF THE LEFT ANKLE WITHOUT CONTRAST  CT 3D CT IMAGE RENDERING ON ACQUISITION WORKSTATION  TECHNIQUE: Multidetector CT imaging of the left ankle was performed according to the standard protocol. Multiplanar CT image reconstructions were also generated.  COMPARISON:  Left knee radiographs performed earlier today at 4:50 p.m.  FINDINGS: There is a comminuted trimalleolar fracture, with mild lateral displacement of the medial malleolar fragment, an oblique fracture through the distal fibular diaphysis, and a minimally displaced posterior malleolar fracture. There is slight medial widening of the ankle mortise, with slight lateral talar tilt.  No additional fractures are seen. The subtalar joint is unremarkable in appearance. The sinus RC is unremarkable. Visualized joint spaces are otherwise preserved.  Mild soft tissue injury is noted about the ankle, adjacent to visualized fractures. The flexor and extensor tendons are grossly unremarkable. The peroneal tendons are within normal limits. The vasculature is not well assessed without contrast. The Achilles tendon remains intact.  3-dimensional CT images were rendered by post-processing of the original CT data at the CT scanner. The 3-dimensional CT images were interpreted, and findings were reported in the accompanying complete CT report for this study.  IMPRESSION: 1. Comminuted trimalleolar fracture, with mild lateral displacement of the medial malleolar fragment, an oblique fracture through the distal fibular diaphysis, and  a minimally displaced posterior malleolar fracture. Slight medial widening of the ankle mortise, with slight lateral talar tilt. 2. Mild soft tissue injury noted about the ankle.   Electronically Signed   By: Roanna Raider M.D.   On: 12/08/2014 21:27   Ct 3d Recon At Scanner  12/08/2014    CLINICAL DATA:  Status post fall. Lateral dislocation of the patella. Unable to stand after injury. Initial encounter.  EXAM: CT OF THE LEFT ANKLE WITHOUT CONTRAST  CT 3D CT IMAGE RENDERING ON ACQUISITION WORKSTATION  TECHNIQUE: Multidetector CT imaging of the left ankle was performed according to the standard protocol. Multiplanar CT image reconstructions were also generated.  COMPARISON:  Left knee radiographs performed earlier today at 4:50 p.m.  FINDINGS: There is a comminuted trimalleolar fracture, with mild lateral displacement of the medial malleolar fragment, an oblique fracture through the distal fibular diaphysis, and a minimally displaced posterior malleolar fracture. There is slight medial widening of the ankle mortise, with slight lateral talar tilt.  No additional fractures are seen. The subtalar joint is unremarkable in appearance. The sinus RC is unremarkable. Visualized joint spaces are otherwise preserved.  Mild soft tissue injury is noted about the ankle, adjacent to visualized fractures. The flexor and extensor tendons are grossly unremarkable. The peroneal tendons are within normal limits. The vasculature is not well assessed without contrast. The Achilles tendon remains intact.  3-dimensional CT images were rendered by post-processing of the original CT data at the CT scanner. The 3-dimensional CT images were interpreted, and findings were reported in the accompanying complete CT report for this study.  IMPRESSION: 1. Comminuted trimalleolar fracture, with mild lateral displacement of the medial malleolar fragment, an oblique fracture through the distal fibular diaphysis, and a minimally displaced posterior malleolar fracture. Slight medial widening of the ankle mortise, with slight lateral talar tilt. 2. Mild soft tissue injury noted about the ankle.   Electronically Signed   By: Roanna Raider M.D.   On: 12/08/2014 21:27   Dg Knee Complete 4 Views Left  12/08/2014   CLINICAL DATA:  Fall on  floor hand bathroom with left knee pain and swelling. Initial encounter.  EXAM: LEFT KNEE - COMPLETE 4+ VIEW  COMPARISON:  Prior CT and radiographs of the left knee dated 08/16/2013  FINDINGS: No evidence of acute fracture or dislocation. No significant joint effusion identified. There is suggestion of lateral joint space narrowing.  IMPRESSION: No acute fracture.   Electronically Signed   By: Irish Lack M.D.   On: 12/08/2014 17:11   Dg Ankle Left Port  12/09/2014   CLINICAL DATA:  19 year old female status post ORIF of trimalleolar left ankle fracture  EXAM: PORTABLE LEFT ANKLE - 2 VIEW  COMPARISON:  Preoperative radiographs 12/08/2014  FINDINGS: Interval ORIF of a bimalleolar fracture with a lateral buttress plate and screw construct transfixing the distal fibular fracture as well as a single inter fragmentary screw. 2 cannulated lag screws transfix the medial malleolus fracture. There is associated soft tissue swelling. Alignment of the fracture fragments has significantly improved. No evidence of immediate hardware complication. The patient is in a fiberglass splint.  IMPRESSION: ORIF of bimalleolar fracture as above without evidence of immediate hardware complication.  Alignment of the fracture fragments is improved.   Electronically Signed   By: Malachy Moan M.D.   On: 12/09/2014 17:47    Assessment/Plan: 1 Day Post-Op   Active Problems:   Trimalleolar fracture of left ankle  Patient is stable postop from an orthopedic  standpoint. Occupational therapy arrived while I was in the room to work with the patient. She was seen by physical therapy this morning. Patient has baseline hypotonia which affects her upper body strength. This will likely affect her ability to use a walker or crutches. She also has a concomitant knee injury following a patellar dislocation during her fall. Her knee pain and effusion will also likely affect her PT. Patient should continue to ice and elevate her left lower  extremity. She will be a minimum of 6 weeks nonweightbearing on the left lower extremity. Patient's father is going to speak with you on [facility] and will explain to them the patient's limitations. Upon discharge we can arrange for home health PT for the patient for safety evaluation and lower extremity strengthening. Patient will follow up with me in 10-14 days after discharge for staple removal and cast application. She would likely benefit from a handicap accessible room on campus and will likely need transportation to get class. Patient has been prescribed a wheelchair with a left leg support as well as a rolling walker. Patient will remain hospitalized for pain control and neurovascular monitoring as well as physical and occupational therapy assessment. Her father will discuss post discharge arrangements with [facility] for [place] before she can be discharged [place] to campus.   Juanell Fairly , MD 12/10/2014, 1:32 PM

## 2014-12-10 NOTE — Evaluation (Signed)
Occupational Therapy Evaluation Patient Details Name: Toni Buchanan MRN: [id number] DOB: 1996-01-25 Today's Date: 12/10/2014    History of Present Illness This patient is a 19 year old female who came to [facility] after a fall in bathroom sustaining L ankle trimalleolar fx with displacement of medial and lateral malleoli. Her L patella also dislocated laterally but spontaneously relocated after several minutes (Dad reports patellar dislocations in the past).  She received a L ankle ORIF 12/09/14. 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   Clinical Impression   This patient is a 19 year old female with the above history. She lives in an apartment with 3 roommates with no adapted devices. She is a Consulting civil engineer at [employer] and had been independent with activities of daily living and functional mobility. She now needs assist and would benefit from Occupational Therapy for ADL/functioal mobility training.      Follow Up Recommendations       Equipment Recommendations       Recommendations for Other Services  (No further OT after dischare, PT to follow through with upper body strength when appropriate.)     Precautions / Restrictions Precautions Precautions: Fall Required Braces or Orthoses: Other Brace/Splint Other Brace/Splint: L ankle splint Restrictions Weight Bearing Restrictions: Yes LLE Weight Bearing: Non weight bearing Other Position/Activity Restrictions: Elevate L LE      Mobility Bed Mobility  Transfers            Balance                              ADL                                         General ADL Comments: Patient had been independent . She now requires assist. Her sister will be bringing some clothing (hopefully big enough to go over the splint) and will try dressing. Suggested getting raised toilet set or commode that goes over toilet and a reacher to reach objects from wheel chair level. Given  list of vendors for reacher.     Vision     Perception     Praxis             Hand Dominance     Extremity/Trunk Assessment Upper Extremity Assessment Upper Extremity Assessment:  (B UE 4-/5 grip R 48 L 35 low upper body endurance.)         Communication Communication Communication: No difficulties   Cognition Arousal/Alertness: Awake/alert Behavior During Therapy: WFL for tasks assessed/performed Overall Cognitive Status: Within Functional Limits for tasks assessed                     General Comments       Exercises       Shoulder Instructions      Home Living Family/patient expects to be discharged to:: Unsure (Dad looking into help on [place] campus) Living Arrangements:  (Appartment with roomates.) Available Help at Discharge: Family;Friend(s) (Family lives in [place]) Type of Home: Apartment Home Access: Level entry     Home Layout: One level         Bathroom Toilet: Standard     Home Equipment: None          Prior Functioning/Environment Level of Independence: Independent  OT Diagnosis: Generalized weakness;Acute pain   OT Problem List: Decreased strength;Decreased range of motion;Decreased activity tolerance;Pain   OT Treatment/Interventions:      OT Goals(Current goals can be found in the care plan section) Acute Rehab OT Goals Patient Stated Goal: To have less L ankle pain OT Goal Formulation: With patient Time For Goal Achievement: 12/24/14 Potential to Achieve Goals: Good  OT Frequency:     Barriers to D/C:            Co-evaluation              End of Session Equipment Utilized During Treatment:  (reacher)  Activity Tolerance:   Patient left:     Time: 1610-9604 OT Time Calculation (min): 25 min Charges:  OT General Charges $OT Visit: 1 Procedure OT Evaluation $Initial OT Evaluation Tier I: 1 Procedure OT Treatments $Self Care/Home Management : 8-22 mins G-Codes:    Gwyndolyn Kaufman, MS/OTR/L  12/10/2014, 2:09 PM

## 2014-12-10 NOTE — Care Management (Addendum)
RX needed for rolling walker, crutches, and standard wheelchair. I met with patient's father Cay Schillings [phone] to discuss discharge planning. Patient is now unsure whether she wants to complete her last semester at [employer] or return at a later time. I am checking on manual wheelchair as recommended by PT. Knee scooter will not be an option at this time per PT due to swelling in involved knee. I am unsure if patient will need Lovenox- MD to advise. I have left message for Whitney at [employer] disability office [phone] to attempt to coordinate discharge plan if patient returns to [facility] at discharge (handicap Lucianne Lei, someone to help her with wheelchair to class, physical therapy, etc.). It will be difficult for patient to drive due to getting in and out of her SUV. Patient walked 15 feet this AM with PT. Father updated on list of home health agencies   Per [facility]: Knee Scooter $80/month and manual wheelchair $30/month- Patient notified.

## 2014-12-10 NOTE — Progress Notes (Signed)
Physical Therapy Treatment Patient Details Name: Toni Buchanan MRN: [id number] DOB: 07-30-1995 Today's Date: 12/10/2014    History of Present Illness Pt is a 19 y.o. female s/p fall in bathroom sustaining L ankle trimalleolar fx with displacement of medial and lateral malleoli; pt's L patella also dislocated laterally but spontaneously relocated after several minutes (pt with h/o patellar dislocations per pt and pt's father).  Pt s/p L ankle ORIF 12/09/14.    PT Comments    Pt demonstrating significant improved L LE pain (1/10) this afternoon and was able to ambulate 100 feet with RW CGA.  Pt did well maintaining L LE NWB'ing status and was steady without loss of balance using RW.  Pt still requires some vc's for RW use but anticipate pt will do well with RW for home use (needs RW for balance).  Will continue to progress pt per pt tolerance.   Follow Up Recommendations   (HHPT vs OP PT)     Equipment Recommendations  Rolling walker with 5" wheels;3in1 (PT) (manual w/c with L LE elevating leg rest; may need shower chair)    Recommendations for Other Services       Precautions / Restrictions Precautions Precautions: Fall Required Braces or Orthoses: Other Brace/Splint Other Brace/Splint: L ankle splint Restrictions Weight Bearing Restrictions: Yes LLE Weight Bearing: Non weight bearing Other Position/Activity Restrictions: Elevate L LE    Mobility  Bed Mobility Overal bed mobility: Needs Assistance Bed Mobility: Sit to Supine     Sit to supine: Supervision;HOB elevated   General bed mobility comments: increased time to perform and used UE's to assist with L LE into bed  Transfers Overall transfer level: Needs assistance Equipment used: Rolling walker (2 wheeled) Transfers: Sit to/from Stand Sit to Stand: Min guard         General transfer comment: vc's required for hand and feet placement and technique  Ambulation/Gait Ambulation/Gait assistance: Min  guard Ambulation Distance (Feet): 100 Feet Assistive device: Rolling walker (2 wheeled)   Gait velocity: decreased   General Gait Details: NWB L LE; decreased R LE step length/foot clearance/heelstrike; vc's required for technique initially; steady without loss of balance   Stairs            Wheelchair Mobility    Modified Rankin (Stroke Patients Only)       Balance Overall balance assessment: Needs assistance Sitting-balance support: Bilateral upper extremity supported;Feet supported Sitting balance-[name]: Good     Standing balance support: Bilateral upper extremity supported (on RW) Standing balance-[name]: Good                      Cognition Arousal/Alertness: Awake/alert Behavior During Therapy: WFL for tasks assessed/performed Overall Cognitive Status: Within Functional Limits for tasks assessed                      Exercises   Performed semi-supine LE therapeutic exercise x 10 reps:  Ankle pumps (AROM R LE); quad sets x3 second holds (AROM B LE's); glute squeezes x3 second holds (AROM B); heelslides (AROM R; AAROM L), hip abd/adduction (AROM R; AAROM L), and SLR (AAROM L LE).  Pt required vc's and tactile cues for correct technique with exercises.     General Comments General comments (skin integrity, edema, etc.): L knee swelling noted  Nursing cleared pt for participation in physical therapy.  Pt agreeable to PT session.      Pertinent Vitals/Pain Pain Assessment: 0-10 Pain Score: 1  Pain Location: L ankle Pain Descriptors / Indicators: Sore;Throbbing Pain Intervention(s): Limited activity within patient's tolerance;Monitored during session;Repositioned;Ice applied (Pt declined pain medication prior to PT session.)  Vitals stable and WFL throughout treatment session.    Home Living Family/patient expects to be discharged to:: Unsure (Dad looking into help on [employer] campus) Living Arrangements:  (Appartment with  roomates.) Available Help at Discharge: Family;Friend(s) (Family lives in [place]) Type of Home: Apartment Home Access: Level entry   Home Layout: One level Home Equipment: None      Prior Function Level of Independence: Independent          PT Goals (current goals can now be found in the care plan section) Acute Rehab PT Goals Patient Stated Goal: To have less L ankle pain PT Goal Formulation: With patient Time For Goal Achievement: 12/24/14 Potential to Achieve Goals: Good Progress towards PT goals: Progressing toward goals    Frequency  BID    PT Plan Current plan remains appropriate    Co-evaluation             End of Session Equipment Utilized During Treatment: Gait belt Activity Tolerance: Patient tolerated treatment well Patient left: with call bell/phone within reach;with bed alarm set;with family/visitor present;with SCD's reapplied (L LE elevated on pillows)     Time: 1500-1540 PT Time Calculation (min) (ACUTE ONLY): 40 min  Charges:  $Gait Training: 8-22 mins $Therapeutic Exercise: 8-22 mins $Therapeutic Activity: 8-22 mins                    G CodesHendricks Limes 14-Dec-2014, 4:28 PM Hendricks Limes, PT [phone]

## 2014-12-10 NOTE — Progress Notes (Signed)
Clinical Child psychotherapist (CSW) received SNF consult. PT is recommending home. RN case manager aware of above. Please reconsult if future social work needs arise. CSW signing off.   Jetta Lout, LCSWA [phone]

## 2014-12-10 NOTE — Evaluation (Signed)
Physical Therapy Evaluation Patient Details Name: Toni Buchanan MRN: [id number] DOB: Apr 15, 1995 Today's Date: 12/10/2014   History of Present Illness  Pt is a 19 y.o. female s/p fall in bathroom sustaining L ankle trimalleolar fx with displacement of medial and lateral malleoli; pt's L patella also dislocated laterally but spontaneously relocated after several minutes (pt with h/o patellar dislocations per pt and pt's father).  Pt s/p L ankle ORIF 12/09/14.  Clinical Impression  Currently pt demonstrates impairments with L LE strength, L LE knee ROM (L ankle immobilized in splint), pain, balance, and limitations with functional mobility.  Prior to admission, pt was independent with functional mobility and going to [facility].  Pt lives in an apt (10-15 minute walk to [facility]) with level entry.  Currently pt is min assist with bed mobility, min assist with transfers, and CGA with ambulation 15 feet with RW.  Pt has baseline hypotonia which affects her strength (per pt's father who reports pt has been seen at other hospitals d/t this).  Pt would benefit from skilled PT to address above noted impairments and functional limitations.  To discharge [facility] to [facility] and be able to go to her classes, anticipate pt would require AD (RW vs axillary crutches) for mobility within the home and manual w/c (with L LE elevating leg rest) in order to attend her classes on campus.  D/t apt distance from campus, pt would require transportation (for pt and manual w/c) to/from campus and anticipate (pending on location of classes and distance/accessibility between classes) pt may require assist (to push w/c or transportation) to get between classes as well.  With pt's L knee swelling/injury, question if pt would be able to tolerate kneeling on knee scooter for longer distances.  Pending on pt's access to transportation, recommend OP PT if able to access (if not, recommend HHPT).    Follow Up Recommendations   (Home Health PT vs OP PT)    Equipment Recommendations  Other (comment) (RW vs axillary crutches pending pt's progress; manual w/c; bedside commode; may need shower chair pending on disposition on discharge)    Recommendations for Other Services       Precautions / Restrictions Precautions Precautions: Fall Required Braces or Orthoses: Other Brace/Splint Other Brace/Splint: L ankle splint Restrictions Weight Bearing Restrictions: Yes LLE Weight Bearing: Non weight bearing Other Position/Activity Restrictions: Elevate L LE      Mobility  Bed Mobility Overal bed mobility: Needs Assistance Bed Mobility: Supine to Sit     Supine to sit: Min assist;HOB elevated     General bed mobility comments: vc's required for technique and assist to scoot to edge of bed  Transfers Overall transfer level: Needs assistance Equipment used: Rolling walker (2 wheeled) Transfers: Sit to/from Stand Sit to Stand: Min assist         General transfer comment: vc's required for hand and feet placement and technique  Ambulation/Gait Ambulation/Gait assistance: Min guard Ambulation Distance (Feet): 15 Feet Assistive device: Rolling walker (2 wheeled)   Gait velocity: decreased   General Gait Details: NWB L LE; decreased R LE step length/foot clearance/heelstrike; vc's required for technique; limited distance d/t pain and fatigue  Stairs            Wheelchair Mobility    Modified Rankin (Stroke Patients Only)       Balance Overall balance assessment: Needs assistance Sitting-balance support: Bilateral upper extremity supported;Feet supported Sitting balance-[name]: Good     Standing balance support: Bilateral upper  extremity supported (on RW) Standing balance-[name]: Good                               Pertinent Vitals/Pain Pain Assessment: 0-10 Pain Score: 6  Pain Location: L ankle Pain Descriptors / Indicators: Sore;Throbbing Pain  Intervention(s): Limited activity within patient's tolerance;Monitored during session;Premedicated before session;Repositioned;Ice applied (L LE elevated on 2 pillows)  Vitals stable and WFL throughout treatment session.    Home Living Family/patient expects to be discharged to:: Unsure Living Arrangements:  (apt close to [facility]) Available Help at Discharge: Family;Friend(s) (Family lives in [place]) Type of Home: Apartment Home Access: Level entry     Home Layout: One level Home Equipment: None      Prior Function Level of Independence: Independent               Hand Dominance        Extremity/Trunk Assessment   Upper Extremity Assessment: Overall WFL for tasks assessed           Lower Extremity Assessment: RLE deficits/detail;LLE deficits/detail RLE Deficits / Details: R LE strength and ROM WFL LLE Deficits / Details: L ankle immobilized in splint; L knee flexion/extension 3-/5; L hip flexion 3-/5; L knee flexion 70 degrees (firm end feel) and L knee extension 15 degrees short of neutral (pt reports her L knee does not straighten baseline)  Cervical / Trunk Assessment: Normal  Communication   Communication: No difficulties  Cognition Arousal/Alertness: Awake/alert Behavior During Therapy: WFL for tasks assessed/performed Overall Cognitive Status: Within Functional Limits for tasks assessed                      General Comments General comments (skin integrity, edema, etc.): L knee swelling noted  Nursing cleared pt for participation in physical therapy.  Pt received pain medication prior to session.  Pt agreeable to PT session.  Pt's father present during session and her sister part of session.     Exercises  Extra time spent discussing with pt and pt's father regarding PT recommendations and options for discharge (including disposition, DME, assist levels required; mobility to attend classes at [facility]).      Assessment/Plan     PT Assessment Patient needs continued PT services  PT Diagnosis Difficulty walking;Acute pain   PT Problem List Decreased strength;Decreased range of motion;Decreased activity tolerance;Decreased balance;Decreased mobility;Decreased knowledge of use of DME;Decreased knowledge of precautions;Impaired sensation;Pain  PT Treatment Interventions DME instruction;Gait training;Stair training;Functional mobility training;Therapeutic activities;Therapeutic exercise;Balance training;Patient/family education;Wheelchair mobility training   PT Goals (Current goals can be found in the Care Plan section) Acute Rehab PT Goals Patient Stated Goal: To have less L ankle pain PT Goal Formulation: With patient Time For Goal Achievement: 12/24/14 Potential to Achieve Goals: Fair    Frequency BID   Barriers to discharge        Co-evaluation               End of Session Equipment Utilized During Treatment: Gait belt Activity Tolerance: Patient limited by pain Patient left: in chair;with call bell/phone within reach;with family/visitor present;with SCD's reapplied (L LE elevated on 2 pillows) Nurse Communication: Mobility status;Precautions;Weight bearing status         Time: 1610-9604 PT Time Calculation (min) (ACUTE ONLY): 65 min   Charges:   PT Evaluation $Initial PT Evaluation Tier I: 1 Procedure PT Treatments $Therapeutic Activity: 23-37 mins   PT G Codes:  Irving Burton Yassmin Binegar 12/10/2014, 1:30 PM Hendricks Limes, PT [phone]

## 2014-12-11 NOTE — Progress Notes (Signed)
Occupational Therapy Treatment Patient Details Name: Toni Buchanan MRN: [id number] DOB: 06/20/1995 Today's Date: 12/11/2014    History of present illness Pt is a 19 y.o. female s/p fall in bathroom sustaining L ankle trimalleolar fx with displacement of medial and lateral malleoli; pt's L patella also dislocated laterally but spontaneously relocated after several minutes (pt with h/o patellar dislocations per pt and pt's father).  Pt s/p L ankle ORIF 12/09/14.   OT comments  Patient understands dressing techniques using reacher.   Follow Up Recommendations  No OT follow up    Equipment Recommendations       Recommendations for Other Services      Precautions / Restrictions Precautions Precautions: Fall Required Braces or Orthoses: Other Brace/Splint Other Brace/Splint: L ankle splint Restrictions Weight Bearing Restrictions: Yes LLE Weight Bearing: Non weight bearing Other Position/Activity Restrictions: Elevate L LE       Mobility Bed Mobility   Transfers          Balance                         ADL  Patient practiced techniques for lower body dressing using reacher (could not reach her left foot with out a reacher). Able to donn pants using reacher.                                              Vision                     Perception     Praxis      Cognition   Behavior During Therapy: WFL for tasks assessed/performed Overall Cognitive Status: Within Functional Limits for tasks assessed                       Extremity/Trunk Assessment               Exercises     Shoulder Instructions       General Comments      Pertinent Vitals/ Pain       Pain Assessment: 0-10 Pain Score: 4  Pain Location: L ankle Pain Descriptors / Indicators: Sore;Tender Pain Intervention(s): Limited activity within patient's tolerance;Monitored during session;Premedicated before session;Repositioned;Ice applied (L LE  elevated on 3 pillows)  Home Living Family/patient expects to be discharged to:: Private residence (home in [place].)   Available Help at Discharge: Family;Friend(s) Type of Home: Apartment                                  Prior Functioning/Environment              Frequency       Progress Toward Goals  OT Goals(current goals can now be found in the care plan section)     Acute Rehab OT Goals Patient Stated Goal: To have less L ankle pain  Plan      Co-evaluation                 End of Session Equipment Utilized During Treatment:  (reacher)   Activity Tolerance     Patient Left     Nurse Communication          Time: 6387-5643 OT Time Calculation (min): 15 min  Charges: OT General Charges $OT Visit: 1 Procedure OT Treatments $Self Care/Home Management : 8-22 mins  Elyn Peers M 12/11/2014, 11:24 AM

## 2014-12-11 NOTE — Progress Notes (Signed)
Physical Therapy Treatment Patient Details Name: Toni Buchanan MRN: [id number] DOB: [birth year] Today's Date: 12/11/2014    History of Present Illness Pt is a 19 y.o. female s/p fall in bathroom sustaining L ankle trimalleolar fx with displacement of medial and lateral malleoli; pt's L patella also dislocated laterally but spontaneously relocated after several minutes (pt with h/o patellar dislocations per pt and pt's father).  Pt s/p L ankle ORIF 12/09/14.    PT Comments    Pt able to progress ambulation distance with RW this morning and appeared steady with RW and able to maintain NWB'ing L LE status.  Pt educated on how to safely use and navigate manual w/c.  Pt's father declined trialing stairs today (reports he has a set-up already figured out to get up the steps) and also reports pt will perform sponge-baths at home d/t no shower on main level.  Pt's father reports plan to discharge Toni Buchanan to their home in [place] instead of going Toni Buchanan to [employer].   Follow Up Recommendations   (HHPT vs OPPT)     Equipment Recommendations  Rolling walker with 5" wheels;3in1 (PT) (manual w/c with L LE elevating legrest)    Recommendations for Other Services       Precautions / Restrictions Precautions Precautions: Fall Required Braces or Orthoses: Other Brace/Splint Other Brace/Splint: L ankle splint Restrictions Weight Bearing Restrictions: Yes LLE Weight Bearing: Non weight bearing Other Position/Activity Restrictions: Elevate L LE    Mobility  Bed Mobility Overal bed mobility: Needs Assistance Bed Mobility: Supine to Sit     Supine to sit: Supervision     General bed mobility comments: increased time to perform but able to move L LE out of bed without use of UE's; bed flat  Transfers Overall transfer level: Needs assistance Equipment used: Rolling walker (2 wheeled);None Transfers: Sit to/from [employer] to Stand: Supervision (with RW) Stand pivot  transfers: Min guard (bed to w/c and w/c to recliner transfer no AD)       General transfer comment: vc's required  initially for hand and feet placement and technique with stand pivot transfers without AD  Ambulation/Gait Ambulation/Gait assistance: Supervision Ambulation Distance (Feet): 160 Feet Assistive device: Rolling walker (2 wheeled)       General Gait Details: NWB L LE; decreased R LE step length/foot clearance/heelstrike; steady without loss of balance   Stairs            Wheelchair Mobility  Pt propelled w/c with UE's 90 feet with SBA; vc's and assist required initially for brakes, w/c set-up for transfers, and navigating tight spaces and turns but pt improved with practice/repetition.  Modified Rankin (Stroke Patients Only)       Balance Overall balance assessment: Needs assistance Sitting-balance support: No upper extremity supported Sitting balance-[name]: Normal     Standing balance support: Bilateral upper extremity supported (on RW) Standing balance-[name]: Good                      Cognition Arousal/Alertness: Awake/alert Behavior During Therapy: WFL for tasks assessed/performed Overall Cognitive Status: Within Functional Limits for tasks assessed                      Exercises      General Comments General comments (skin integrity, edema, etc.): L knee swelling noted  Nursing cleared pt for participation in physical therapy.  Pt agreeable to PT session. Pt's father present towards end of  session and therapist answered his questions regarding transport in car home (regarding elevating L LE and preventing blood clots d/t concerns of extended time in car).  Discussed pt's mobility needs per above status and pt's father reports his family is able to provide this.  Pt's father reports he has a system he feels good about getting pt into home via using chair to sit on on platform step to access home and declined trying stairs  in hospital.      Pertinent Vitals/Pain Pain Assessment: 0-10 Pain Score: 4  Pain Location: L ankle Pain Descriptors / Indicators: Sore;Tender Pain Intervention(s): Limited activity within patient's tolerance;Monitored during session;Premedicated before session;Repositioned;Ice applied (L LE elevated on 3 pillows)  See flowsheet for HR and O2 vitals.    Home Living                      Prior Function            PT Goals (current goals can now be found in the care plan section) Acute Rehab PT Goals Patient Stated Goal: To have less L ankle pain PT Goal Formulation: With patient Time For Goal Achievement: 12/24/14 Potential to Achieve Goals: Good Progress towards PT goals: Progressing toward goals    Frequency  BID    PT Plan Current plan remains appropriate    Co-evaluation             End of Session Equipment Utilized During Treatment: Gait belt Activity Tolerance: Patient tolerated treatment well Patient left: in chair;with call bell/phone within reach;with family/visitor present (L LE elevated on 3 pillows)     Time: 4098-1191 PT Time Calculation (min) (ACUTE ONLY): 45 min  Charges:  $Gait Training: 8-22 mins $Therapeutic Activity: 8-22 mins $Wheel Chair Management: 8-22 mins                    G CodesHendricks Limes January 08, 2015, 11:17 AM Hendricks Limes, PT [phone]

## 2014-12-11 NOTE — Progress Notes (Addendum)
Subjective:  Patient's postoperative day #2 status post ORIF of trimalleolar left ankle fracture. Patient reports pain as moderate but improving.  Patient is up out of bed to a chair. Patient's father is in the room. Patient explains that she worked daily with physical therapy which included use of a walker and wheelchair. She had some difficulty with navigating the wheelchair.  Objective:   VITALS:   Filed Vitals:   12/10/14 2002 12/11/14 0519 12/11/14 0736 12/11/14 1025  BP: 124/84 135/81 129/81   Pulse: 102 99 112 117  Temp: 98.7 F (37.1 C) 99.1 F (37.3 C) 97.9 F (36.6 C)   TempSrc: Oral Oral Oral   Resp: Height:      Weight:      SpO2: 100% 99% 93% 96%    PHYSICAL EXAM:  Left lower extremity: Patient is sitting up in a chair today. Her left leg is elevated on a pillow. Her splint is clean dry and intact. Ice packs are in place. Her toes are well-perfused. She can flex and extend her toes and has no pain with passive stretch. She is intact sensation to light touch in all 5 toes including the first dorsal webspace. Patient's left knee remains swollen. The skin is intact. Ecchymosis is still seen over her left knee.Marland Kitchen   LABS  No results found for this or any previous visit (from the past 24 hour(s)).  Dg Ankle Left Port  12/09/2014   CLINICAL DATA:  19 year old female status post ORIF of trimalleolar left ankle fracture  EXAM: PORTABLE LEFT ANKLE - 2 VIEW  COMPARISON:  Preoperative radiographs 12/08/2014  FINDINGS: Interval ORIF of a bimalleolar fracture with a lateral buttress plate and screw construct transfixing the distal fibular fracture as well as a single inter fragmentary screw. 2 cannulated lag screws transfix the medial malleolus fracture. There is associated soft tissue swelling. Alignment of the fracture fragments has significantly improved. No evidence of immediate hardware complication. The patient is in a fiberglass splint.  IMPRESSION: ORIF of  bimalleolar fracture as above without evidence of immediate hardware complication.  Alignment of the fracture fragments is improved.   Electronically Signed   By: Malachy Moan M.D.   On: 12/09/2014 17:47    Assessment/Plan: 2 Days Post-Op   Active Problems:   Trimalleolar fracture of left ankle  status post left patella dislocation  Patient is showing improvement postoperatively. She still has intermittent significant pain. She will continue taking oxycodone and morphine as needed for pain. Patient is improving with physical therapy but I feel would benefit from additional therapy sessions. Patient and her family have decided to return to [place] upon discharge. She will not return to [facility].  Prescriptions have been written for a rolling walker and wheelchair with a left leg support. The patient's father is going to pick these items up. I would like to change the patient's cast tomorrow prior to her discharge. Ankle swelling should be stabilized by then and I would like to check the surgical sites for evidence of infection prior to discharge. I feel the cast would be more stable for her to transport in then her AO splint. Patient should continue to be neurovascularly monitored and have her pain controlled before being discharged. I also think she still is gaining upper extremity strength and balance through physical therapy and therefore recommend she continue to work PT for another few sessions prior to discharge. Therapy will also continue to work on use of a wheelchair  with her.  I do not feel she is safe for discharge yet at this time.    Juanell Fairly , MD 12/11/2014, 1:42 PM

## 2014-12-11 NOTE — Care Management (Signed)
Wheelchair cancelled with [facility]; Out of network. Patient's father is going to [employer] to pick up wheelchair; Rx given to father. Patient plans to return to her home in [place] tomorrow by private vehicle. OTPT referral faxed: [facility] fax [phone] phone [phone].  Dr. Martha Clan plans on placing cast tomorrow and will arrange follow up with ortho surgeon in [place].

## 2014-12-11 NOTE — Clinical Documentation Improvement (Signed)
Orthopedic  Abnormal Lab/Test Results:  + U/A. Please document findings in next progress note and include in discharge summary if applicable.  Possible Clinical Conditions associated with below indicators  UTI ruled in  UTI ruled out  Other Condition  Cannot Clinically Determine  Supporting Information:  Treatment Provided:  PO Bactrim, Septra Q12H initiated  Please exercise your independent, professional judgment when responding. A specific answer is not anticipated or expected.  Thank You,  Shellee Milo BSN, RN Health Information Management [place] [phone]

## 2014-12-11 NOTE — Progress Notes (Signed)
Phone laptop

## 2014-12-11 NOTE — Progress Notes (Signed)
Physical Therapy Treatment Patient Details Name: Toni Buchanan MRN: [id number] DOB: May 07, 1995 Today's Date: 12/11/2014    History of Present Illness Pt is a 19 y.o. female s/p fall in bathroom sustaining L ankle trimalleolar fx with displacement of medial and lateral malleoli; pt's L patella also dislocated laterally but spontaneously relocated after several minutes (pt with h/o patellar dislocations per pt and pt's father).  Pt s/p L ankle ORIF 12/09/14.    PT Comments    Pt's father picked up rental w/c and left it in room for PT session (L leg rest elevating; R leg rest standard).  Session focused on managing w/c safely, safe transfers, ambulation with RW, and navigating steps into family home.  Pt does fairly well navigating with RW household distances (and keeping L LE NWB'ing status) but still requires assist/cueing for transfers without an AD, safely lining up w/c for transfers, and navigating with w/c (although improved navigation noted with use of R LE in addition to B UE's).  Pt also requiring some assist with w/c set-up but is improving with practice.  Plan to see pt again tomorrow morning before cast is placed.  Plan for pt to discharge to family home in [place] (long distance drive from [facility]) with 47/8 assist/support.   Follow Up Recommendations  Outpatient PT     Equipment Recommendations  Rolling walker with 5" wheels;3in1 (PT) (manual w/c with L LE elevating leg rest)    Recommendations for Other Services       Precautions / Restrictions Precautions Precautions: Fall Required Braces or Orthoses: Other Brace/Splint Other Brace/Splint: L ankle splint Restrictions Weight Bearing Restrictions: Yes LLE Weight Bearing: Non weight bearing Other Position/Activity Restrictions: Elevate L LE    Mobility  Bed Mobility                  Transfers Overall transfer level: Needs assistance Equipment used: Rolling walker (2 wheeled);None Transfers: Sit to/from  [employer] to Stand: Supervision (with RW from w/c x3 trials) Stand pivot transfers: Min guard;Supervision (transfer recliner chair to w/c; w/c to commode)       General transfer comment: vc's required  intermittently for hand and feet placement and technique with stand pivot transfers without AD; pt steady and safe with transfers using RW  Ambulation/Gait Ambulation/Gait assistance: Supervision Ambulation Distance (Feet): 70 Feet Assistive device: Rolling walker (2 wheeled)   Gait velocity: decreased   General Gait Details: NWB L LE; decreased R LE step length/foot clearance/heelstrike; steady without loss of balance; limited distance d/t pt reporting her UE's feeling somewhat sore from this morning's session   Stairs Stairs: Yes Stairs assistance: Min guard (2nd SBA for safety) Stair Management:  (chair without arms) Number of Stairs: 1 General stair comments: pt navigated stair via turning (with RW) with Hafner to step so she was able to sit on chair (without arms) that was placed on top of the platform step, then turn/pivot her bottom on chair; then stand with RW and ambulate towards next step with same technique; 2nd assist to make sure chair stabilized  Wheelchair Mobility Wheelchair Mobility Wheelchair mobility: Yes Wheelchair propulsion: Both upper extremities;Right lower extremity (L LE elevated on elevating legrest) Wheelchair parts: Supervision/cueing Distance: 90 Wheelchair Assistance Details (indicate cue type and reason): Pt educated via vc's, demo, and via practice regarding how to fold/unfold w/c, remove and secure/place armrest, lock and unlock brakes B, remove and place B legrests, how to elevate L LE; pt also educated on how to line  up to surfaces in w/c for safe transfers, always to lock B brakes when not propelling w/c, and how to navigate turns and tight spaces.  W/C fitted to pt and L brake tightened for safety (was loose and not locking  initially)  Modified Rankin (Stroke Patients Only)       Balance Overall balance assessment: Needs assistance Sitting-balance support: No upper extremity supported Sitting balance-[name]: Normal     Standing balance support: Bilateral upper extremity supported (on RW) Standing balance-[name]: Good                      Cognition Arousal/Alertness: Awake/alert Behavior During Therapy: WFL for tasks assessed/performed Overall Cognitive Status: Within Functional Limits for tasks assessed                      Exercises      General Comments General comments (skin integrity, edema, etc.): L knee swelling noted  Nursing cleared pt for participation in physical therapy.  Pt agreeable to PT session.      Pertinent Vitals/Pain Pain Assessment: 0-10 Pain Score: 4  Pain Location: L ankle Pain Descriptors / Indicators: Sore;Tender Pain Intervention(s): Limited activity within patient's tolerance;Monitored during session;Premedicated before session;Repositioned  Vitals stable and WFL throughout treatment session.    Home Living                      Prior Function            PT Goals (current goals can now be found in the care plan section) Acute Rehab PT Goals Patient Stated Goal: To have less L ankle pain PT Goal Formulation: With patient Time For Goal Achievement: 12/24/14 Potential to Achieve Goals: Good Progress towards PT goals: Progressing toward goals    Frequency  BID    PT Plan Current plan remains appropriate    Co-evaluation             End of Session Equipment Utilized During Treatment: Gait belt Activity Tolerance: Patient tolerated treatment well Patient left:  (on bedside commode with NA present)     Time: 1455-1555 PT Time Calculation (min) (ACUTE ONLY): 60 min  Charges:  $Gait Training: 8-22 mins $Therapeutic Activity: 8-22 mins $Wheel Chair Management: 23-37 mins                    G CodesHendricks Limes 2014-12-27, 4:37 PM Hendricks Limes, PT [phone]

## 2014-12-12 MED ORDER — ASPIRIN EC 325 MG PO TBEC: 325.0000 mg | DELAYED_RELEASE_TABLET | Freq: Two times a day (BID) | ORAL | Status: AC

## 2014-12-12 MED ORDER — ACETAMINOPHEN 160 MG/5ML PO SOLN
500.0000 mg | ORAL | Status: AC | PRN
Start: 2014-12-12 — End: ?

## 2014-12-12 MED ORDER — SULFAMETHOXAZOLE-TRIMETHOPRIM 400-80 MG PO TABS: 1.0000 | ORAL_TABLET | Freq: Two times a day (BID) | ORAL | Status: AC

## 2014-12-12 MED ORDER — OXYCODONE HCL 5 MG/5ML PO SOLN: 10.0000 mg | ORAL | Status: AC | PRN

## 2014-12-12 NOTE — Progress Notes (Signed)
Physical Therapy Treatment Patient Details Name: Toni Buchanan MRN: [id number] DOB: 1995/05/08 Today's Date: 12/12/2014    History of Present Illness Pt is a 19 y.o. female s/p fall in bathroom sustaining L ankle trimalleolar fx with displacement of medial and lateral malleoli; pt's L patella also dislocated laterally but spontaneously relocated after several minutes (pt with h/o patellar dislocations per pt and pt's father).  Pt s/p L ankle ORIF 12/09/14.    PT Comments    Pt demonstrating progress toward all goals. Pt is fluent and safe with all mobility, including WC and pivot transfers with RW. Pt/family given education and practice with simulated car transfers and reports confidence in being able to perform. HEP deferred at this time, as patient reports that she is able to perform with supervision and minA from father. Pt will benefit from skilled intervention to restore to PLOF in household mobility and indep in all ADL.   Follow Up Recommendations  Outpatient PT     Equipment Recommendations  Rolling walker with 5" wheels;3in1 (PT)    Recommendations for Other Services       Precautions / Restrictions Precautions Precautions: Fall Required Braces or Orthoses: Other Brace/Splint Other Brace/Splint: L ankle splint Restrictions Weight Bearing Restrictions: Yes LLE Weight Bearing: Non weight bearing Other Position/Activity Restrictions: Elevate L LE    Mobility  Bed Mobility Overal bed mobility: Needs Assistance Bed Mobility:  (Scooting in long sitting, to simualte car transfers. )           General bed mobility comments: Requires minA and heavy verbal cues, but is confident in being able to perform for car transfers to transport home.   Transfers Overall transfer level: Needs assistance Equipment used: Rolling walker (2 wheeled);Pushed w/c Transfers: Sit to/from [employer] Sit to Stand: Supervision Stand pivot transfers: Min guard       General  transfer comment: Performes well, demonstrate excellent safety awareness and fluent technique.   Ambulation/Gait Ambulation/Gait assistance: Min guard Ambulation Distance (Feet): 120 Feet Assistive device: Rolling walker (2 wheeled)   Gait velocity: decreased 0.46m/s    General Gait Details: NWB L LE; decreased R LE step length/foot clearance/heelstrike; steady without loss of balance; limited distance so she can practice with WC also.    Stairs Stairs:  (Pt declined, reports fluency. )          Merchant navy officer mobility: Yes Wheelchair propulsion: Both upper extremities;Right lower extremity Wheelchair parts: Supervision/cueing Distance: 200 ft Wheelchair Assistance Details (indicate cue type and reason): education given on set up break-down of foot rest on L. Pt is abel to perform with supervision.   Modified Rankin (Stroke Patients Only)       Balance Overall balance assessment: Modified Independent Sitting-balance support: No upper extremity supported;Feet supported Sitting balance-[name]: Normal     Standing balance support: Bilateral upper extremity supported Standing balance-[name]: Good                      Cognition Arousal/Alertness: Awake/alert Behavior During Therapy: WFL for tasks assessed/performed Overall Cognitive Status: Within Functional Limits for tasks assessed                      Exercises      General Comments        Pertinent Vitals/Pain Pain Assessment: 0-10 Pain Score: 3  Pain Location: L ankle  Pain Intervention(s): Limited activity within patient's tolerance;Monitored during session;Premedicated before session  Home Living                      Prior Function            PT Goals (current goals can now be found in the care plan section) Acute Rehab PT Goals Patient Stated Goal: To have less L ankle pain PT Goal Formulation: With patient Time For Goal  Achievement: 12/24/14 Potential to Achieve Goals: Good Progress towards PT goals: Progressing toward goals    Frequency  BID    PT Plan Current plan remains appropriate    Co-evaluation             End of Session Equipment Utilized During Treatment: Gait belt Activity Tolerance: Patient tolerated treatment well;No increased pain Patient left: in chair;with family/visitor present     Time: 0842-0920 PT Time Calculation (min) (ACUTE ONLY): 38 min  Charges:  $Gait Training: 23-37 mins $Therapeutic Activity: 8-22 mins                    G Codes:      Aidee Latimore C January 04, 2015, 9:31 AM 9:33 AM  Rosamaria Lints, PT, DPT [place] License # [id number]

## 2014-12-12 NOTE — Discharge Instructions (Signed)
The patient will be discharged today.  Patient will continue to elevate her left lower extremity and will begin enteric-coated aspirin 325 mg by mouth twice a day for DVT prophylaxis. Her left lower extremity is casted. She should remain nonweightbearing on the left lower extremity for a minimum of 6 weeks. Outpatient physical therapy has been arranged for the patient. She will be following up with a physical therapist who is well-known to her.   She has been prescribed a rolling walker and wheelchair which have already been obtained by her father. Patient's family would like her to follow up at the [facility] and have contacted Dr. Rhae Hammock office and faxed medical records in an effort to make this happen. Patient will be prescribed oxycodone for pain.  Patient will also work with physical therapy on strengthening her quadriceps hamstring hip abductors and core along with knee range of motion to treat for her recent patellar dislocation. She has had recurrent patellar dislocations including one during her most recent fall. Her patella is currently located. Her left knee is currently Ace wrappecd to help reduce her knee effusion.

## 2014-12-12 NOTE — Progress Notes (Signed)
  Subjective:  Patient is up out of bed to a chair. Her pain is improved today. She continues to make progress with physical therapy.   Objective:   VITALS:   Filed Vitals:   12/11/14 1619 12/11/14 2002 12/12/14 0534 12/12/14 0836  BP: 117/73 128/79 120/78 130/78  Pulse: 108 97 97 100  Temp: 98.1 F (36.7 C) 98.3 F (36.8 C) 98.2 F (36.8 C) 99.3 F (37.4 C)  TempSrc: Oral Oral Oral Oral  Resp: Height:      Weight:      SpO2: 99% 100% 100% 100%    PHYSICAL EXAM:  Patient's splint and dressing are removed today. Her left ankle incisions are clean and dry. There is no erythema or ecchymosis or drainage. She does not have significant ankle edema. There is no calf tenderness or lower leg edema. She can cautiously flex and extend her toes and ankle. She has palpable pedal pulses. Her foot is well perfused. She has intact sensation light touch throughout the left lower extremity.   LABS  No results found for this or any previous visit (from the past 24 hour(s)).  No results found.  Assessment/Plan: 3 Days Post-Op   Active Problems:   Trimalleolar fracture of left ankle  Patient is doing well postop. She was changed to a cast today in preparation of her travel home to [place] tomorrow. Patient's pain is controlled. She has made good progress with physical therapy and is ready for discharge. At the family's request I have contacted Dr. Rhae Hammock office at the [facility] to arrange for follow-up. Patient will be discharged on oxycodone for pain. She will take enteric-coated aspirin 325 mg by mouth twice a day for DVT prophylaxis. She'll continue to elevate her left lower extremity and her possible. She is to remain nonweightbearing on left lower extremity for a minimum of 6 weeks. I'm happy to see her [place] in follow-up when she returns to the [place] area.   Juanell Fairly , MD 12/12/2014, 12:09 PM

## 2014-12-12 NOTE — Progress Notes (Signed)
Patient discharging home. Instructions and prescriptions given to patient and father, verbalized understanding. Splint changed to hard cast by krasinski md. Waiting on father for transportation.

## 2014-12-12 NOTE — Progress Notes (Signed)
OT Cancellation Note  Patient Details Name: Toni Buchanan MRN: [id number] DOB: December 23, 1995   Cancelled Treatment:     Attempted to see patient this am, she reports she went over the use of adaptive equipment yesterday with therapist, her dad bought her a Sports administrator.  She declined treatment and reported she knows how to use it for dressing skills and was verbally able to recall instructions.  She declined toileting and reported she has been several times without difficulty.  She is planning to return home this date with family.  Will discharge patient at this time.   [name] 12/12/2014, 9:56 AM

## 2014-12-12 NOTE — Discharge Summary (Addendum)
Physician Discharge Summary  Patient ID: Toni Buchanan MRN: [id number] DOB/AGE: 1995-08-18 19 y.o.  Admit date: 2014/12/27 Discharge date: 12/12/2014  Admission Diagnoses:  LEFT TRIMALLEOLAR ANKLE FRACTURE    Discharge Diagnoses:  LEFT TRIMALLEOLAR ANKLE FRACTURE  Active Problems:   Trimalleolar fracture of left ankle s/p open reduction internal fixation.   History reviewed. No pertinent past medical history.  Surgeries: Procedure(s): OPEN REDUCTION INTERNAL FIXATION (ORIF) ANKLE FRACTURE on 12/27/14 - 12/09/2014   Consultants (if any):    Discharged Condition: Improved  Hospital Course: Toni Buchanan is an 19 y.o. female who was admitted 2014/12/27 with a diagnosis of CLOSED LEFT TRIMALLEOLAR ANKLE FRACTURE.  Preoperative urinalysis suggested possible UTI. She was started on Bactrim for this preoperatively and continued postoperatively.  She went to the operating room on 12/09/2014 and underwent an open reduction internal fixation of the medial and lateral malleolar fractures.  Patient underwent an uncomplicated surgery and returned to the orthopedic floor postoperatively.   She was given perioperative antibiotics:  Anti-infectives    Start     Dose/Rate Route Frequency Ordered Stop   12/09/14 2200  sulfamethoxazole-trimethoprim (BACTRIM,SEPTRA) 400-80 MG per tablet 1 tablet     1 tablet Oral Every 12 hours 12/09/14 1822     12/09/14 1815  ceFAZolin (ANCEF) IVPB 1 g/50 mL premix     1 g 100 mL/hr over 30 Minutes Intravenous Every 6 hours 12/09/14 1808 12/10/14 0613   12-27-14 2200  sulfamethoxazole-trimethoprim (BACTRIM,SEPTRA) 400-80 MG per tablet 1 tablet  Status:  Discontinued     1 tablet Oral Every 12 hours 12-27-14 2116 12/09/14 1808    . Patient did well postoperatively. She completed 24 hours of postop Ancef. Her Foley catheter was removed on postop day #1. She began physical and occupational therapy on postop day #1 as well.  She was given sequential compression devices,  TED stockings, and Lovenox for DVT prophylaxis while an inpatient. Patient's pain was well controlled during the postoperative stay. She continued strict elevation of left lower extremity throughout her hospitalization. She made good progress with physical therapy during her hospitalization. Given her clinical improvement she was prepared for discharge on postoperative day #3.  She benefited maximally from the hospital stay and there were no complications.    Recent vital signs:  Filed Vitals:   12/12/14 0836  BP: 130/78  Pulse: 100  Temp: 99.3 F (37.4 C)  Resp: 18    Recent laboratory studies:  Lab Results  Component Value Date   HGB 11.5* 12/09/2014   HGB 13.0 01/15/2013   HGB 14.6 01/12/2013   Lab Results  Component Value Date   WBC 5.1 12/09/2014   PLT 184 12/09/2014   No results found for: INR Lab Results  Component Value Date   NA 139 12/09/2014   K 3.6 12/09/2014   CL 109 12/09/2014   CO2 26 12/09/2014   BUN 15 12/09/2014   CREATININE 0.75 12/09/2014   GLUCOSE 97 12/09/2014    Discharge Medications:     Medication List    Notice    You have not been prescribed any medications.      Diagnostic Studies: Dg Ankle Complete Left  12/27/14   CLINICAL DATA:  Acute left ankle pain and swelling after falling in bathroom. Initial encounter.  EXAM: LEFT ANKLE COMPLETE - 3+ VIEW  COMPARISON:  None.  FINDINGS: Moderately displaced oblique fracture is seen involving the distal left fibula. Mildly displaced and comminuted fracture is seen involving the medial malleolus.  Mild lateral dislocation of the talus relative to distal tibia is noted. Mildly displaced posterior malleolar fracture is noted as well. These fractures appear to be closed and posttraumatic.  IMPRESSION: Fractures are seen involving the distal left fibula, medial malleolus and posterior malleolus. Mild lateral dislocation of talus relative to distal tibia is noted.   Electronically Signed   By: Lupita Raider, M.D.   On: 12/08/2014 17:09   Ct Ankle Left Wo Contrast  12/08/2014   CLINICAL DATA:  Status post fall. Lateral dislocation of the patella. Unable to stand after injury. Initial encounter.  EXAM: CT OF THE LEFT ANKLE WITHOUT CONTRAST  CT 3D CT IMAGE RENDERING ON ACQUISITION WORKSTATION  TECHNIQUE: Multidetector CT imaging of the left ankle was performed according to the standard protocol. Multiplanar CT image reconstructions were also generated.  COMPARISON:  Left knee radiographs performed earlier today at 4:50 p.m.  FINDINGS: There is a comminuted trimalleolar fracture, with mild lateral displacement of the medial malleolar fragment, an oblique fracture through the distal fibular diaphysis, and a minimally displaced posterior malleolar fracture. There is slight medial widening of the ankle mortise, with slight lateral talar tilt.  No additional fractures are seen. The subtalar joint is unremarkable in appearance. The sinus RC is unremarkable. Visualized joint spaces are otherwise preserved.  Mild soft tissue injury is noted about the ankle, adjacent to visualized fractures. The flexor and extensor tendons are grossly unremarkable. The peroneal tendons are within normal limits. The vasculature is not well assessed without contrast. The Achilles tendon remains intact.  3-dimensional CT images were rendered by post-processing of the original CT data at the CT scanner. The 3-dimensional CT images were interpreted, and findings were reported in the accompanying complete CT report for this study.  IMPRESSION: 1. Comminuted trimalleolar fracture, with mild lateral displacement of the medial malleolar fragment, an oblique fracture through the distal fibular diaphysis, and a minimally displaced posterior malleolar fracture. Slight medial widening of the ankle mortise, with slight lateral talar tilt. 2. Mild soft tissue injury noted about the ankle.   Electronically Signed   By: Roanna Raider M.D.   On: 12/08/2014  21:27   Ct 3d Recon At Scanner  12/08/2014   CLINICAL DATA:  Status post fall. Lateral dislocation of the patella. Unable to stand after injury. Initial encounter.  EXAM: CT OF THE LEFT ANKLE WITHOUT CONTRAST  CT 3D CT IMAGE RENDERING ON ACQUISITION WORKSTATION  TECHNIQUE: Multidetector CT imaging of the left ankle was performed according to the standard protocol. Multiplanar CT image reconstructions were also generated.  COMPARISON:  Left knee radiographs performed earlier today at 4:50 p.m.  FINDINGS: There is a comminuted trimalleolar fracture, with mild lateral displacement of the medial malleolar fragment, an oblique fracture through the distal fibular diaphysis, and a minimally displaced posterior malleolar fracture. There is slight medial widening of the ankle mortise, with slight lateral talar tilt.  No additional fractures are seen. The subtalar joint is unremarkable in appearance. The sinus RC is unremarkable. Visualized joint spaces are otherwise preserved.  Mild soft tissue injury is noted about the ankle, adjacent to visualized fractures. The flexor and extensor tendons are grossly unremarkable. The peroneal tendons are within normal limits. The vasculature is not well assessed without contrast. The Achilles tendon remains intact.  3-dimensional CT images were rendered by post-processing of the original CT data at the CT scanner. The 3-dimensional CT images were interpreted, and findings were reported in the accompanying complete CT report for this  study.  IMPRESSION: 1. Comminuted trimalleolar fracture, with mild lateral displacement of the medial malleolar fragment, an oblique fracture through the distal fibular diaphysis, and a minimally displaced posterior malleolar fracture. Slight medial widening of the ankle mortise, with slight lateral talar tilt. 2. Mild soft tissue injury noted about the ankle.   Electronically Signed   By: Roanna Raider M.D.   On: 12/08/2014 21:27   Dg Knee Complete 4  Views Left  12/08/2014   CLINICAL DATA:  Fall on floor hand bathroom with left knee pain and swelling. Initial encounter.  EXAM: LEFT KNEE - COMPLETE 4+ VIEW  COMPARISON:  Prior CT and radiographs of the left knee dated 08/16/2013  FINDINGS: No evidence of acute fracture or dislocation. No significant joint effusion identified. There is suggestion of lateral joint space narrowing.  IMPRESSION: No acute fracture.   Electronically Signed   By: Irish Lack M.D.   On: 12/08/2014 17:11   Dg Ankle Left Port  12/09/2014   CLINICAL DATA:  19 year old female status post ORIF of trimalleolar left ankle fracture  EXAM: PORTABLE LEFT ANKLE - 2 VIEW  COMPARISON:  Preoperative radiographs 12/08/2014  FINDINGS: Interval ORIF of a bimalleolar fracture with a lateral buttress plate and screw construct transfixing the distal fibular fracture as well as a single inter fragmentary screw. 2 cannulated lag screws transfix the medial malleolus fracture. There is associated soft tissue swelling. Alignment of the fracture fragments has significantly improved. No evidence of immediate hardware complication. The patient is in a fiberglass splint.  IMPRESSION: ORIF of bimalleolar fracture as above without evidence of immediate hardware complication.  Alignment of the fracture fragments is improved.   Electronically Signed   By: Malachy Moan M.D.   On: 12/09/2014 17:47    Disposition: S/P open reduction, internal fixation for left trimalleolar ankle fracture  The patient will be discharged today. She will be returning to [place] upon discharge. Patient will continue to elevate her left lower extremity and will begin enteric-coated aspirin 325 mg by mouth twice a day for DVT prophylaxis. Her left lower extremity is casted. She should remain nonweightbearing on the left lower extremity for a minimum of 6 weeks. Outpatient physical therapy has been arranged for the patient. She will be following up with a physical therapist  who is well-known to her. She has been prescribed a rolling walker and wheelchair which have already been obtained by her father. Patient's family would like her to follow up at the [facility] and have contacted Dr. Rhae Hammock office and faxed medical records in an effort to make this happen. Patient will be prescribed oxycodone for pain.  Patient will also work with physical therapy on strengthening her quadriceps hamstring hip abductors and core along with knee range of motion to treat for her recent patellar dislocation. She has had recurrent patellar dislocations including one during her most recent fall. Her patella is currently located. Her left knee is currently Ace wrappecd to help reduce her knee effusion.     Signed: Juanell Fairly ,MD 12/12/2014, 12:15 PM

## 2014-12-12 NOTE — Progress Notes (Signed)
Patient up to [facility] one assist with walker. Patient is on her period. Splint on left lower leg dry and intact. Father at bedside. Waiting on Dr Martha Clan to come change to a hard cast.

## 2014-12-12 NOTE — Progress Notes (Signed)
patient home  to [place] tomorrow for recovery. splint to be replaced with hard cast  Before d/c. Patient resting well this shift pain minimal.

## 2016-01-28 IMAGING — CT CT ANKLE*L* W/O CM
3 series · 15 of 33 positions shown, 18 images · non-contrast
Comparison: Left knee radiographs performed earlier today at [DATE]
p.m.

CLINICAL DATA: Status post fall. Lateral dislocation of the
patella. Unable to stand after injury. Initial encounter.

EXAM:
CT OF THE LEFT ANKLE WITHOUT CONTRAST
CT 3D CT IMAGE RENDERING ON ACQUISITION WORKSTATION
TECHNIQUE: Multidetector CT imaging of the left ankle was performed according
to the standard protocol. Multiplanar CT image reconstructions were
also generated.

[Series 9: ankle st axials · axial · 0.39mm/px · z∈[-475,-318]mm · 7 of 95 slices shown, 9 images]
[im 8/95  soft-tissue]
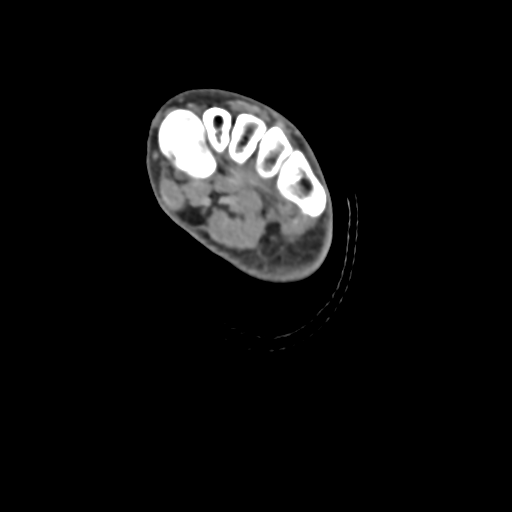
[im 8/95  bone]
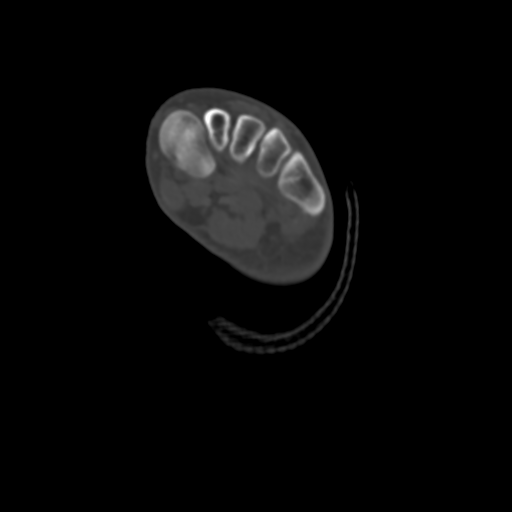
[im 22/95  bone]
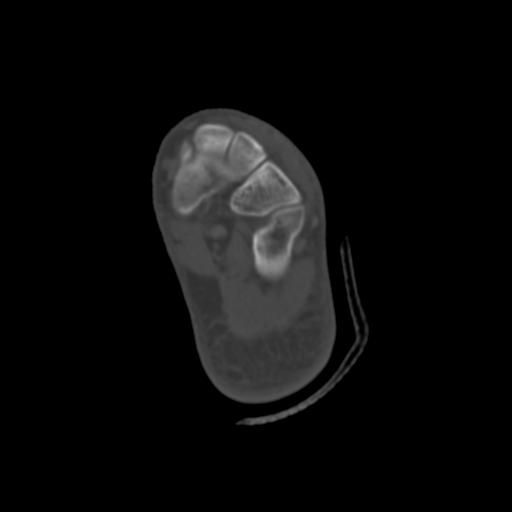
[im 37/95  bone]
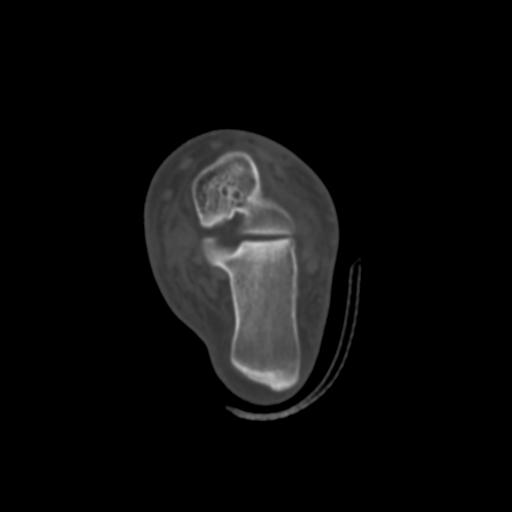
[im 51/95  bone]
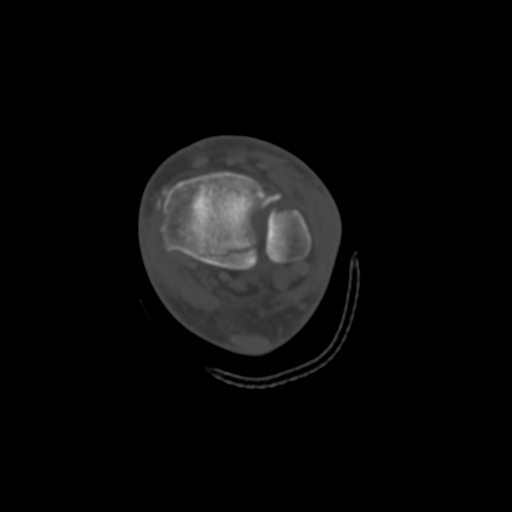
[im 58/95  soft-tissue]
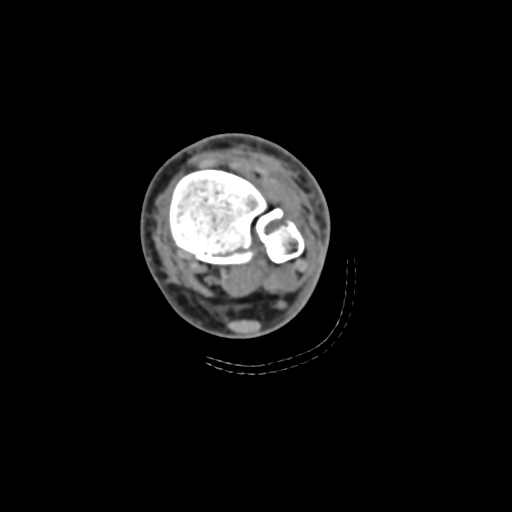
[im 58/95  bone]
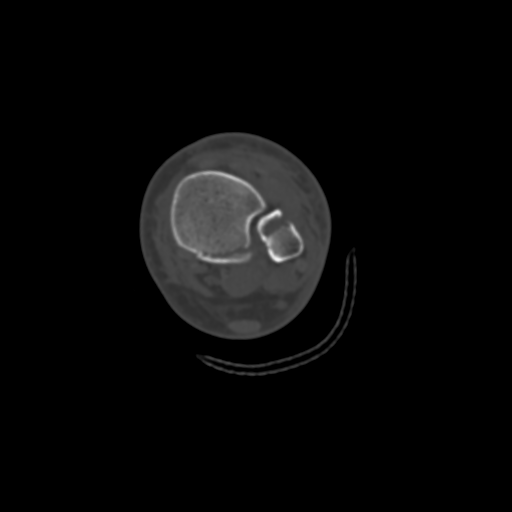
[im 73/95  bone]
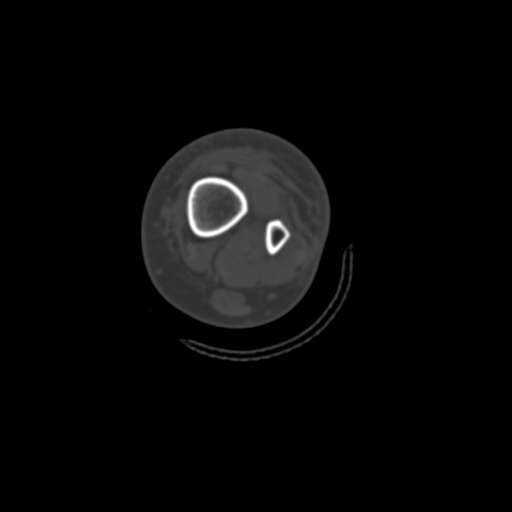
[im 87/95  bone]
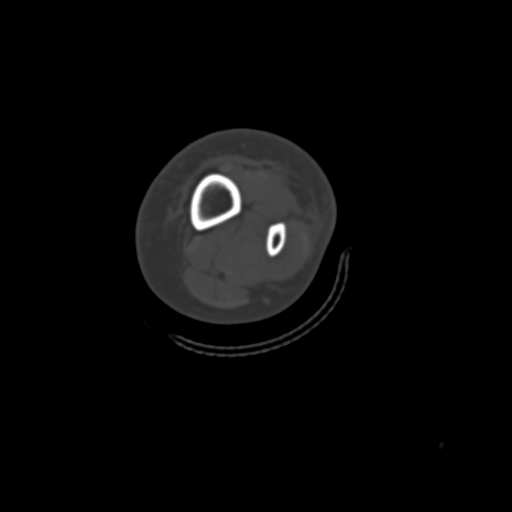

[Series 10: ankle st cor · coronal · 0.31mm/px · 3 of 57 slices shown]
[im 12/57  bone]
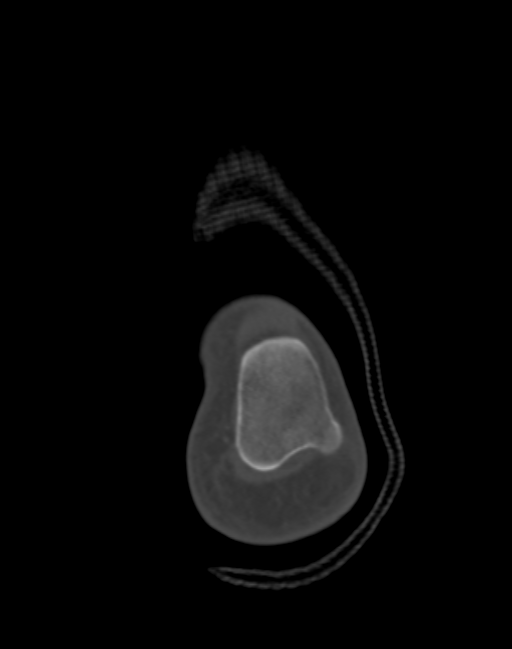
[im 23/57  bone]
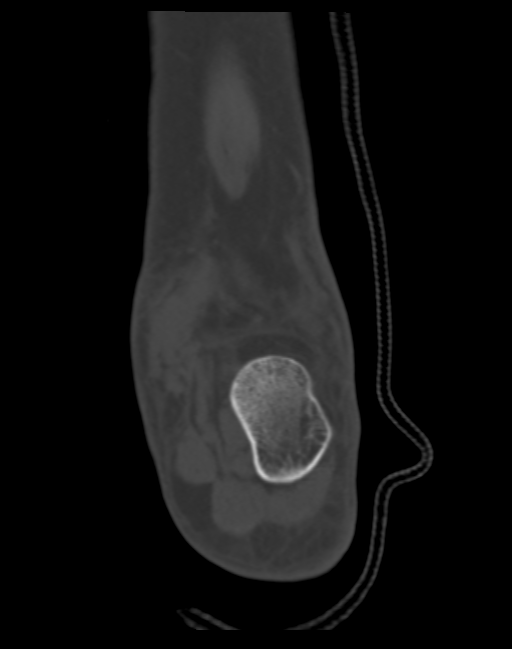
[im 34/57  bone]
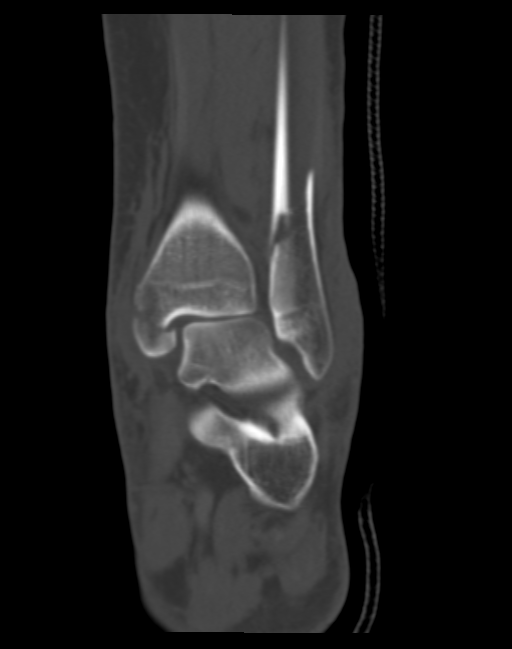

[Series 11: ankle st sag · sagittal · 0.39mm/px · 5 of 45 slices shown, 6 images]
[im 15/45  bone]
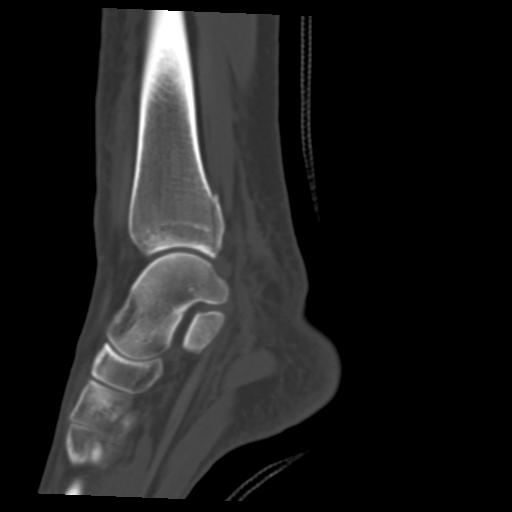
[im 19/45  bone]
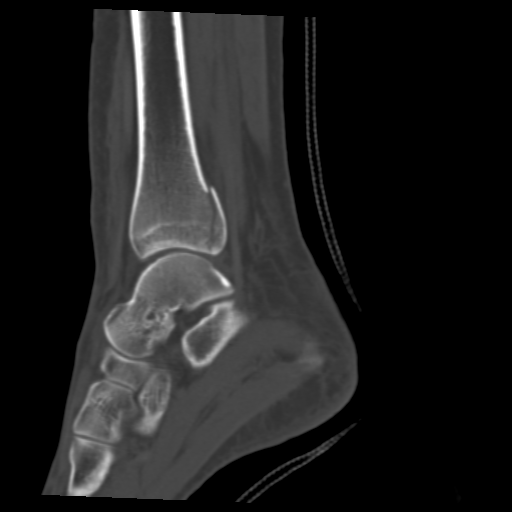
[im 23/45  soft-tissue]
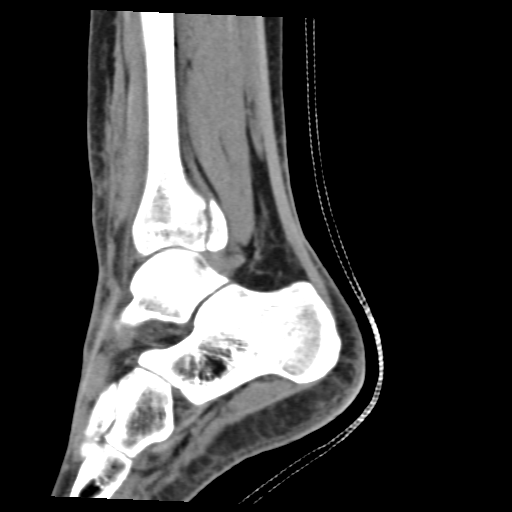
[im 23/45  bone]
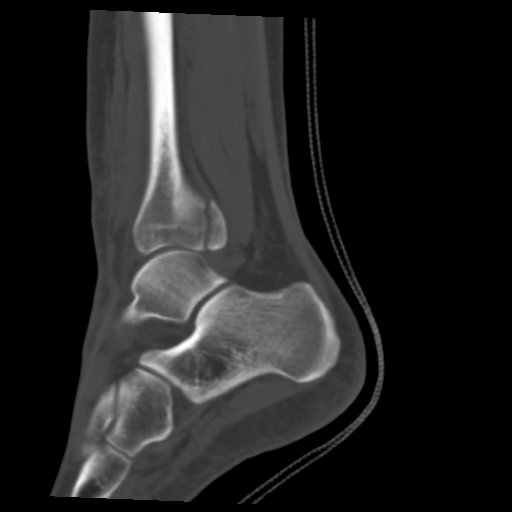
[im 26/45  bone]
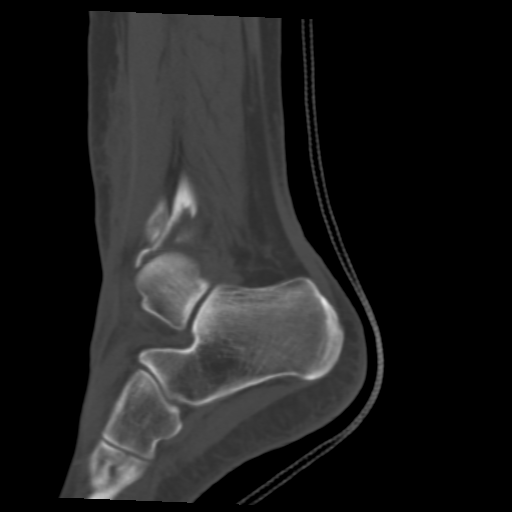
[im 30/45  bone]
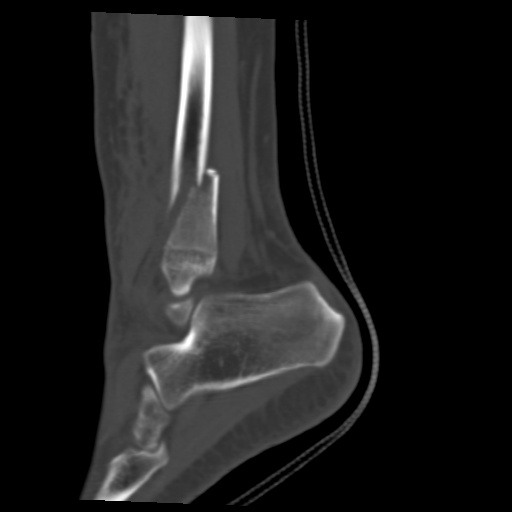

[15 of 33 positions shown; findings below may reference images not displayed]

FINDINGS: There is a comminuted trimalleolar fracture, with mild lateral
displacement of the medial malleolar fragment, an oblique fracture
through the distal fibular diaphysis, and a minimally displaced
posterior malleolar fracture. There is slight medial widening of the
ankle mortise, with slight lateral talar tilt.

No additional fractures are seen. The subtalar joint is unremarkable
in appearance. The sinus RC is unremarkable. Visualized joint spaces
are otherwise preserved.

Mild soft tissue injury is noted about the ankle, adjacent to
visualized fractures. The flexor and extensor tendons are grossly
unremarkable. The peroneal tendons are within normal limits. The
vasculature is not well assessed without contrast. The Achilles
tendon remains intact.

3-dimensional CT images were rendered by post-processing of the
original CT data at the CT scanner. The 3-dimensional CT images were
interpreted, and findings were reported in the accompanying complete
CT report for this study.
IMPRESSION: 1. Comminuted trimalleolar fracture, with mild lateral displacement
of the medial malleolar fragment, an oblique fracture through the
distal fibular diaphysis, and a minimally displaced posterior
malleolar fracture. Slight medial widening of the ankle mortise,
with slight lateral talar tilt.
2. Mild soft tissue injury noted about the ankle.

## 2016-01-29 IMAGING — CR DG ANKLE PORT 2V*L*
1 series · 3 of 3 positions shown · non-contrast
Comparison: Preoperative radiographs 12/08/2014

CLINICAL DATA: 19-year-old female status post ORIF of trimalleolar
left ankle fracture

EXAM:
PORTABLE LEFT ANKLE - 2 VIEW

[Series 1: ap · 0.17mm/px · 3 of 3 slices shown]
[im 1/3]
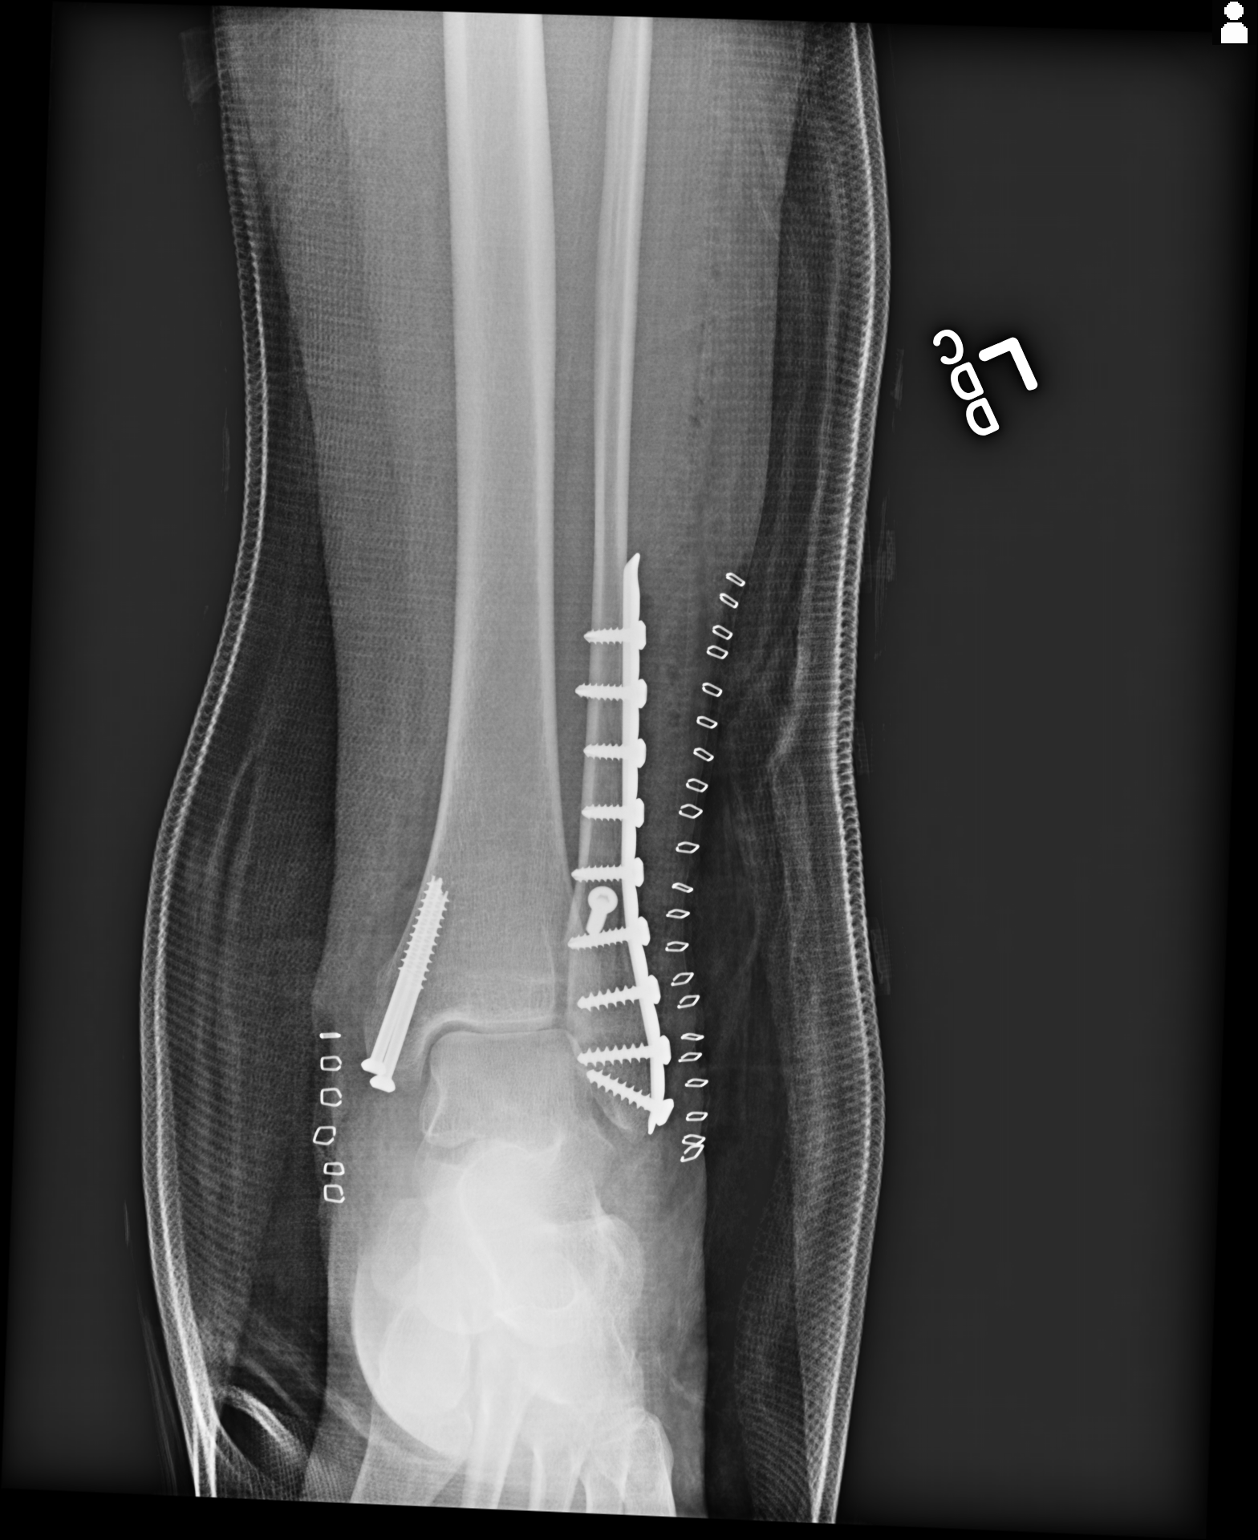
[im 2/3]
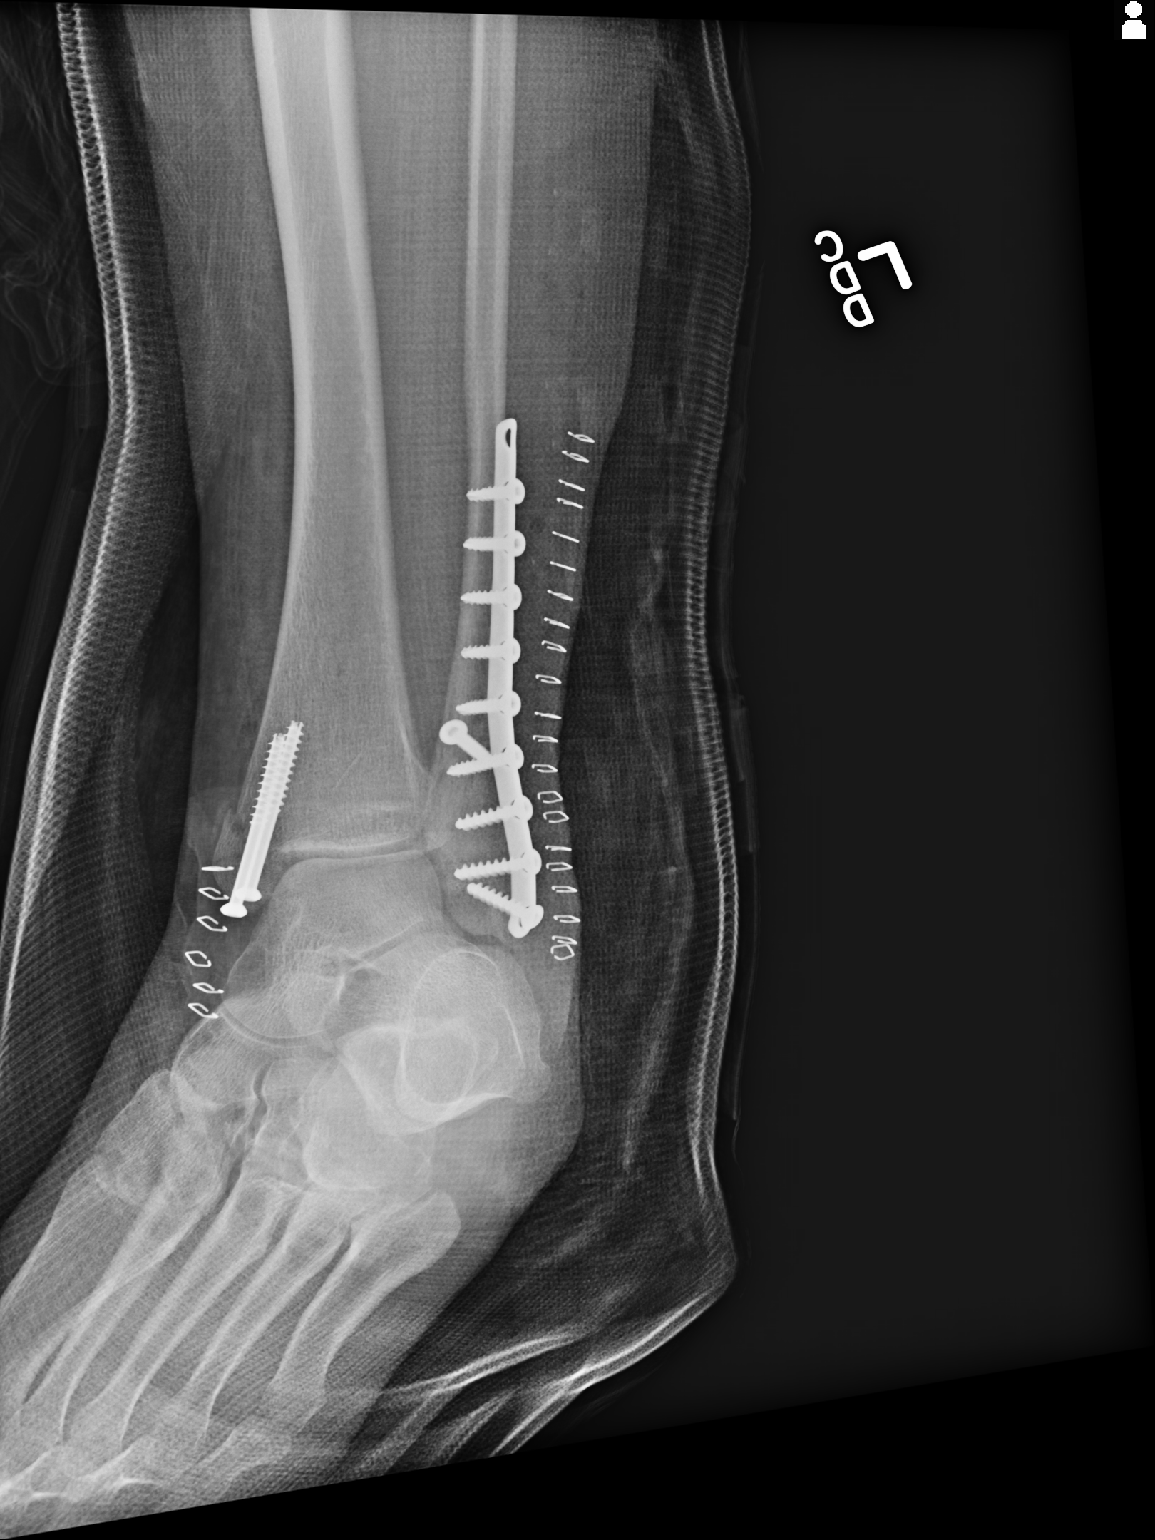
[im 3/3]
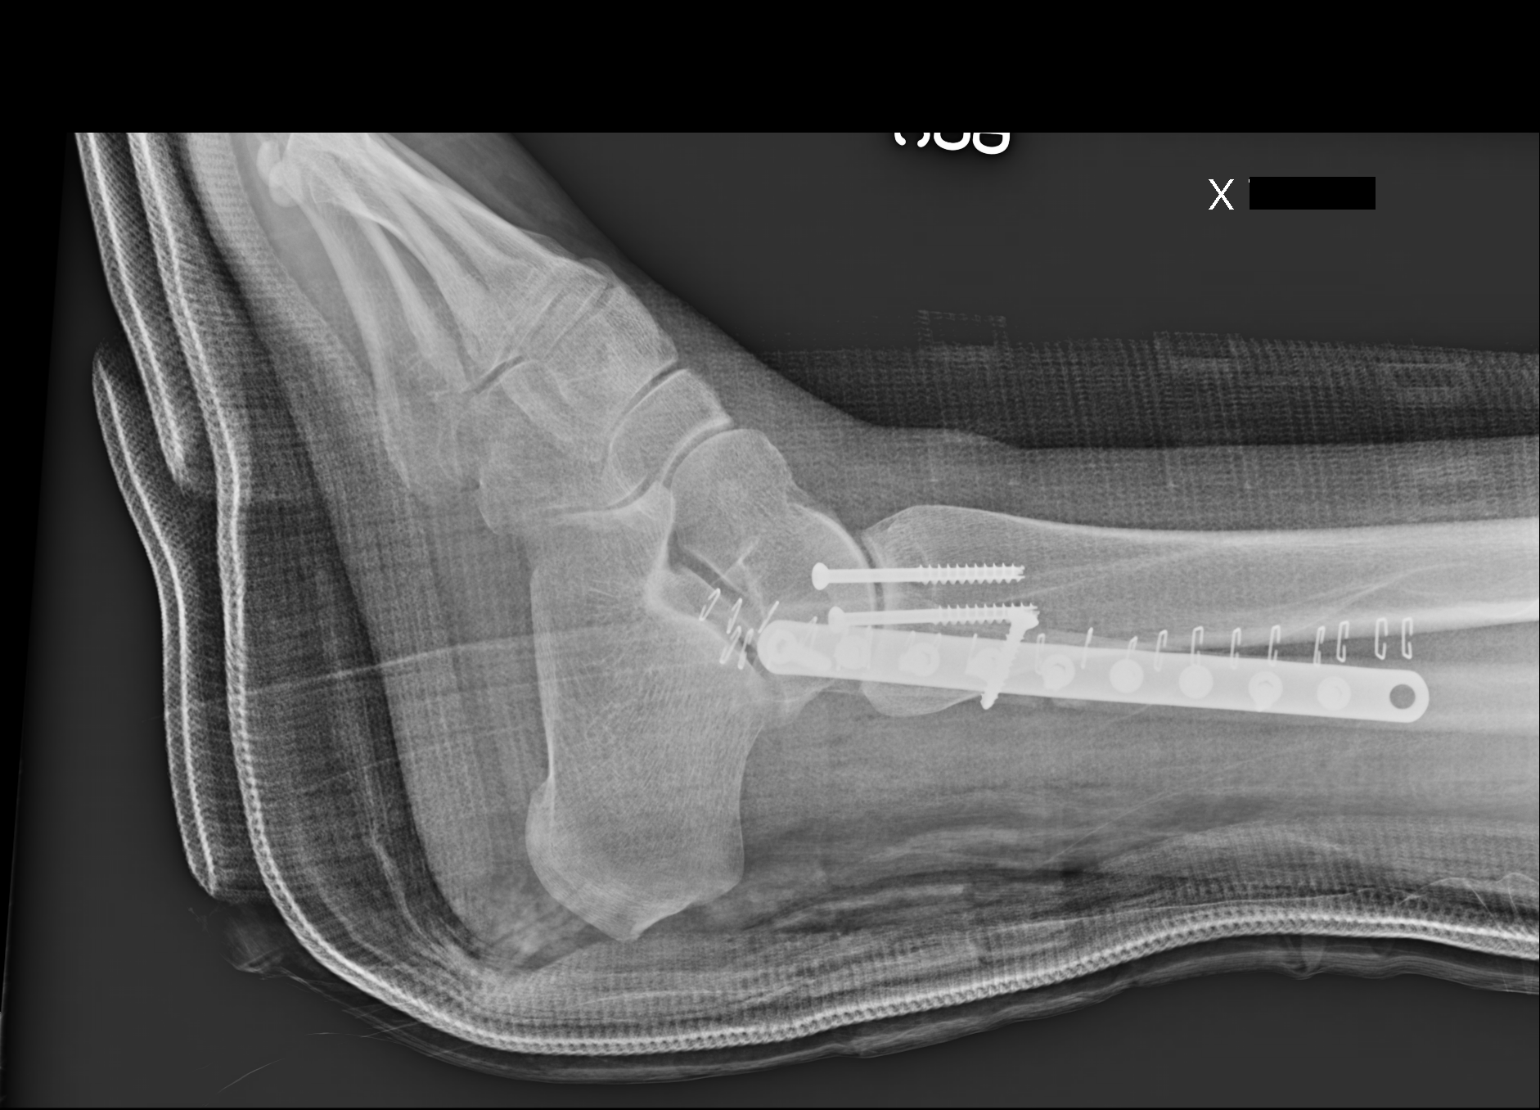

[3 of 3 positions shown; findings below may reference images not displayed]

FINDINGS: Interval ORIF of a bimalleolar fracture with a lateral buttress
plate and screw construct transfixing the distal fibular fracture as
well as a single inter fragmentary screw. 2 cannulated lag screws
transfix the medial malleolus fracture. There is associated soft
tissue swelling. Alignment of the fracture fragments has
significantly improved. No evidence of immediate hardware
complication. The patient is in a fiberglass splint.
IMPRESSION: ORIF of bimalleolar fracture as above without evidence of immediate
hardware complication.

Alignment of the fracture fragments is improved.

## 2021-01-05 NOTE — ED Notes (Signed)
ED Triage Note       ED Triage Adult Entered On:  01/05/2021 21:12 EDT    Performed On:  01/05/2021 21:07 EDT by Hardie Lora, RN, Zeriah               Triage   Numeric Rating Pain Scale :   8   Chief Complaint :   Pt complains of pain in vulva/clitoral area starting monday into tuesday. Pt prescribed fluconazole by telehealth yesterday. Pt states pain feels sharp and has gotten worse.    [place] Mode of Arrival :   Private vehicle   Infectious Disease Documentation :   Document assessment   Patient received chemo or biotherapy last 48 hrs? :   No   Temperature Oral :   36 degC(Converted to: 96.8 degF)    Heart Rate Monitored :   98 bpm   Respiratory Rate :   16 br/min   Systolic Blood Pressure :   139 mmHg   Diastolic Blood Pressure :   113 mmHg (>HHI)    SpO2 :   96 %   Oxygen Therapy :   Room air   Patient presentation :   HR > 100   Chief Complaint or Presentation suggest infection :   No   Dosing Weight Obtained By :   Patient stated   Weight Dosing :   101.3 kg(Converted to: 223 lb 5 oz)    Height :   175 cm(Converted to: 5 ft 9 in)    Body Mass Index Dosing :   33 kg/m2   Martyn Malay - 01/05/2021 21:07 EDT   DCP GENERIC CODE   Tracking Acuity :   4   Tracking Group :   ED [address] [employer]acking [employer]   Lydia, RN, Kristeen Miss - 01/05/2021 21:07 EDT   ED General Section :   Document assessment   Pregnancy Status :   Patient denies   ED Allergies Section :   Document assessment   ED Reason for Visit Section :   Document assessment   ED Home Meds Section :   Document assessment   Martyn Malay - 01/05/2021 21:07 EDT   ID Risk Screen Symptoms   Recent Travel History :   No recent travel   TB Symptom Screen :   No symptoms   Last 90 days COVID-19 ID :   No   Close Contact with COVID-19 ID :   No   Last 14 days COVID-19 ID :   No   C. diff Symptom/History ID :   Neither of the above   Patient Pregnant :   None of the above   Martyn Malay - 01/05/2021 21:07 EDT   Allergies   (As Of: 01/05/2021 21:12:12 EDT)    Allergies (Active)   No Known Medication Allergies  Estimated Onset Date:   Unspecified ; Created By:   Martyn Malay; Reaction Status:   Active ; Category:   Drug ; Substance:   No Known Medication Allergies ; Type:   Allergy ; Updated By:   Martyn Malay; Reviewed Date:   01/05/2021 21:09 EDT        Psycho-Social   Last 3 mo, thoughts killing self/others :   Patient denies   Right click within box for Suspected Abuse policy link. :   None   Feels Safe Where Live :   Yes   ED Behavioral Activity Rating Scale :   4 - Quiet and  awake (normal level of activity)   Hardie Lora, RNKristeen Miss - 01/05/2021 21:07 EDT   ED Home Med List   Medication List   (As Of: 01/05/2021 21:12:12 EDT)   Home Meds    fluconazole  :   fluconazole ; Status:   Documented ; Ordered As Mnemonic:   fluconazole ; Simple Display Line:   0 Refill(s) ; Catalog Code:   fluconazole ; Order Dt/Tm:   01/05/2021 21:10:39 EDT            ED Reason for Visit   (As Of: 01/05/2021 21:12:12 EDT)   Problems(Active)    No Chronic Problems (Cerner  :NKP )  Name of Problem:   No Chronic Problems ; Recorder:   Hardie Lora, RN, Kristeen Miss; Code:   NKP ; Last Updated:   01/05/2021 21:10 EDT ; Life Cycle Date:   01/05/2021 ; Life Cycle Status:   Active ; Vocabulary:   Cerner          Diagnoses(Active)    Vaginal pain-external  Date:   01/05/2021 ; Diagnosis Type:   Reason For Visit ; Confirmation:   Complaint of ; Clinical Dx:   Vaginal pain-external ; Classification:   Medical ; Clinical Service:   Non-Specified ; Code:   PNED ; Probability:   0 ; Diagnosis Code:   [id number]

## 2021-01-06 LAB — WET PREP, GENITAL
Trich, Wet Prep: ABSENT
WBC, Wet Prep: >5 — AB

## 2021-01-06 NOTE — ED Notes (Signed)
 ED Patient Summary       ;       [facility] Emergency Department  [address] [place] [address]  [phone]  Discharge Instructions (Patient)  Name: Connie Barker, Connie Barker  DOB: 14-Aug-1995                   MRN: [id number]                   FIN: [id number]  Reason For Visit: Abscess - simple; Vaginal pain-external; VAGINAL PAIN  Final Diagnosis: Vulvar abscess     Visit Date: 01/05/2021 21:04:00  Address: [address] [place] [address]  Phone: [phone]     Emergency Department Providers:         Primary Physician:      MALONE, THERESA VALDEZ      [facility] would like to thank you for allowing us  to assist you with your healthcare needs. The following includes patient education materials and information regarding your injury/illness.     Follow-up Instructions:  You were seen today on an emergency basis. Please contact your primary care doctor for a follow up appointment. If you received a referral to a specialist doctor, it is important you follow-up as instructed.    It is important that you call your follow-up doctor to schedule and confirm the location of your next appointment. Your doctor may practice at multiple locations. The office location of your follow-up appointment may be different to the one written on your discharge instructions.    If you do not have a primary care doctor, please call [phone] for help in finding a [facility] Provider. For help in finding a specialist doctor, please call [phone].    If your condition gets worse before your follow-up with your primary care doctor or specialist, please return to the Emergency Department.      Coronavirus 2019 (COVID-19) Reminders:     Patients age 30 - 50, with parental consent, and patients over age 59 can make an appointment for a COVID-19 vaccine. Patients can contact their [facility] doctors' offices to schedule an appointment to receive the COVID-19 vaccine.  Patients who do not have a [facility] physician can call [phone] to schedule vaccination appointments.      Follow Up Appointments:  Primary Care Provider:     Name: PCP,  NONE     Phone:                  With: Address: When:   Follow-up with your GYN at [facility] today as scheduled     Comments:   Return to ED if symptoms worsen              Post [facility] SERVICES%>          New Medications  Printed Prescriptions  doxycycline (doxycycline monohydrate 100 mg oral tablet) 1 Tabs Oral (given by mouth) 2 times a day for 10 Days. Refills: 0.  Last Dose:____________________  traMADol (Ultram 50 mg oral tablet) 1 Tabs Oral (given by mouth) every 6 hours as needed moderate pain (4-7) for 5 Days. Refills: 0.  Last Dose:____________________  Medications that have not changed  Other Medications  fluconazole   Last Dose:____________________      Allergy Info: No Known Medication Allergies     Discharge Additional Information          Discharge  Patient 01/06/21 0:36:00 EDT      Patient Education Materials:        Skin Abscess      A skin abscess is an infected area on or under your skin that contains a collection of pus and other material. An abscess may also be called a furuncle, carbuncle, or boil. An abscess can occur in or on almost any part of your body.    Some abscesses break open (rupture) on their own. Most continue to get worse unless they are treated. The infection can spread deeper into the body and eventually into your blood, which can make you feel ill. Treatment usually involves draining the abscess.      What are the causes?    An abscess occurs when germs, like bacteria, pass through your skin and cause an infection. This may be caused by:   A scrape or cut on your skin.     A puncture wound through your skin, including a needle injection or insect bite.     Blocked oil or sweat glands.     Blocked and infected hair follicles.     A cyst that forms beneath your skin (sebaceous cyst)  and becomes infected.        What increases the risk?    This condition is more likely to develop in people who:   Have a weak body defense system (immune system).     Have diabetes.     Have dry and irritated skin.     Get frequent injections or use illegal IV drugs.     Have a foreign body in a wound, such as a splinter.     Have problems with their lymph system or veins.        What are the signs or symptoms?    Symptoms of this condition include:   A painful, firm bump under the skin.     A bump with pus at the top. This may break through the skin and drain.      Other symptoms include:   Redness surrounding the abscess site.     Warmth.     Swelling of the lymph nodes (glands) near the abscess.     Tenderness.     A sore on the skin.        How is this diagnosed?    This condition may be diagnosed based on:   A physical exam.     Your medical history.     A sample of pus. This may be used to find out what is causing the infection.     Blood tests.     Imaging tests, such as an ultrasound, CT scan, or MRI.        How is this treated?    A small abscess that drains on its own may not need treatment. Treatment for larger abscesses may include:   Moist heat or heat pack applied to the area several times a day.     A procedure to drain the abscess (incision and drainage).     Antibiotic medicines. For a severe abscess, you may first get antibiotics through an IV and then change to antibiotics by mouth.        Follow these instructions at home:    Medicines       Take over-the-counter and prescription medicines only as told by your health care provider.     If you were prescribed an antibiotic medicine, take it as  told by your health care provider. Do not stop taking the antibiotic even if you start to feel better.      Abscess care       If you have an abscess that has not drained, apply heat to the affected area. Use the heat source that your health care provider recommends, such as a moist heat pack or a heating  pad.  ? Place a towel between your skin and the heat source.    ? Leave the heat on for 20?30 minutes.    ? Remove the heat if your skin turns bright red. This is especially important if you are unable to feel pain, heat, or cold. You may have a greater risk of getting burned.       Follow instructions from your health care provider about how to take care of your abscess. Make sure you:  ? Cover the abscess with a bandage (dressing).    ? Change your dressing or gauze as told by your health care provider.    ? Wash your hands with soap and water before you change the dressing or gauze. If soap and water are not available, use hand sanitizer.       Check your abscess every day for signs of a worsening infection. Check for:  ? More redness, swelling, or pain.    ? More fluid or blood.    ? Warmth.    ? More pus or a bad smell.        General instructions     To avoid spreading the infection:  ? Do not share personal care items, towels, or hot tubs with others.    ? Avoid making skin contact with other people.       Keep all follow-up visits as told by your health care provider. This is important.        Contact a health care provider if you have:     More redness, swelling, or pain around your abscess.     More fluid or blood coming from your abscess.     Warm skin around your abscess.     More pus or a bad smell coming from your abscess.     A fever.     Muscle aches.     Chills or a general ill feeling.      Get help right away if you:     Have severe pain.     See red streaks on your skin spreading away from the abscess.      Summary     A skin abscess is an infected area on or under your skin that contains a collection of pus and other material.     A small abscess that drains on its own may not need treatment.     Treatment for larger abscesses may include having a procedure to drain the abscess and taking an antibiotic.      This information is not intended to replace advice given to you by your health care  provider. Make sure you discuss any questions you have with your health care provider.      Document Revised: 06/13/2018 Document Reviewed: 04/05/2017  Elsevier Patient Education ? 2021 Elsevier Inc.      ---------------------------------------------------------------------------------------------------------------------  [facility] allows patients to review your COVID and other test results as well as discharge documents from any [facility], Emergency Department, surgical center or outpatient lab. Test results are typically available 36 hours  after the test is completed.     [facility] encourages you to self-enroll in the [facility] Patient Portal.     To begin your self-enrollment process, please visit [email or web address] Under [facility], click on "Sign up now".     NOTE: You must be 16 years and older to use [facility] Self-Enroll online. If you are a parent, caregiver, or guardian; you need an invite to access your child's or dependent's health records. To obtain an invite, contact the Medical Records department at [phone] Monday through Friday, 8-4:30, select option 3 . If we receive your call afterhours, we will return your call the next business day.     If you have issues trying to create or access your account, contact [employer] support at [phone] available 7 days a week 24 hours a day.     Comment:

## 2021-01-06 NOTE — Discharge Summary (Signed)
ED Clinical Summary                     [facility]  [address]  [place], [place], [address]  [phone]          PERSON INFORMATION  Name: Connie Barker, Connie Barker Age:  25 Years DOB: 11-Oct-1995   Sex: Female Language:  PCP: PCP,  NONE   Marital Status: Single Phone: [phone] Med Service: MED-Medicine   MRN: [id number] Acct# [id number] Arrival: 01/05/2021 21:04:00   Visit Reason: Abscess - simple; Vaginal pain-external; VAGINAL PAIN Acuity: 4 LOS: 000 04:02   Address:    [address] [place] [address]   Diagnosis:    Vulvar abscess  Medications:          New Medications  Printed Prescriptions  doxycycline (doxycycline monohydrate 100 mg oral tablet) 1 Tabs Oral (given by mouth) 2 times a day for 10 Days. Refills: 0.  Last Dose:____________________  traMADol (Ultram 50 mg oral tablet) 1 Tabs Oral (given by mouth) every 6 hours as needed moderate pain (4-7) for 5 Days. Refills: 0.  Last Dose:____________________  Medications that have not changed  Other Medications  fluconazole   Last Dose:____________________      Medications Administered During Visit:                Medication Dose Route   ceftriaxone 500 mg IM   doxycycline 100 mg Oral   HYDROcodone-acetaminophen 15 mL Oral   lidocaine topical 10 mL Topical               Allergies      No Known Medication Allergies      Major Tests and Procedures:  The following procedures and tests were performed during your ED visit.  COMMON PROCEDURES%>  COMMON PROCEDURES COMMENTS%>                PROVIDER INFORMATION               Provider Role Assigned Chaney Born, [facility] ED Nurse 01/05/2021 23:27:44    Wynetta Emery [facility] ED MidLevel 01/05/2021 23:33:49        Attending Physician:  Vernia Buff      Admit Doc  ZOELLER,  CHRISTIAN-MD     Consulting Doc       VITALS INFORMATION  Vital Sign Triage Latest   Temp Oral ORAL_1%> ORAL%>   Temp Temporal TEMPORAL_1%> TEMPORAL%>   Temp Intravascular INTRAVASCULAR_1%> INTRAVASCULAR%>    Temp Axillary AXILLARY_1%> AXILLARY%>   Temp Rectal RECTAL_1%> RECTAL%>   02 Sat 96 % 98 %   Respiratory Rate RATE_1%> RATE%>   Peripheral Pulse Rate PULSE RATE_1%> PULSE RATE%>   Apical Heart Rate HEART RATE_1%> HEART RATE%>   Blood Pressure BLOOD PRESSURE_1%>/ BLOOD PRESSURE_1%>113 mmHg BLOOD PRESSURE%> / BLOOD PRESSURE%>75 mmHg                 Immunizations      No Immunizations Documented This Visit          DISCHARGE INFORMATION   Discharge Disposition: H Outpt-Sent Home   Discharge Location:  Home   Discharge Date and Time:  01/06/2021 01:06:24   ED Checkout Date and Time:  01/06/2021 01:06:24     DEPART REASON INCOMPLETE INFORMATION               Depart Action Incomplete Reason   Interactive View/I&O Recently assessed  Problems      Active           No Chronic Problems              Smoking Status      No Smoking Status Documented         PATIENT EDUCATION INFORMATION  Instructions:     Skin Abscess     Follow up:                   With: Address: When:   Follow-up with your GYN at [facility] today as scheduled     Comments:   Return to ED if symptoms worsen              ED PROVIDER DOCUMENTATION     Patient:   Connie Barker, Connie Barker             MRN: [id number]            FIN: [id number]               Age:   25 years     Sex:  Female     DOB:  Feb 02, 1996   Associated Diagnoses:   Vulvar abscess   Author:   Michela Pitcher      Basic Information   Additional information: Chief Complaint from Nursing Triage Note   Chief Complaint  Chief Complaint: Pt complains of pain in vulva/clitoral area starting monday into tuesday. Pt prescribed fluconazole by telehealth yesterday. Pt states pain feels sharp and has gotten worse. (01/05/21 21:07:00).      History of Present Illness   The patient presents with abscess.  The onset was 2  days ago.  The course/duration of symptoms is constant.  Location: Left genitalia. The character of symptoms is pain, redness, swelling and drainage.  The degree of symptoms is moderate.   Prior episodes: none.  Therapy today: none.  Patient developed vaginal pain 2 days ago with increased swelling.  The area started draining today.  She has an appointment to see her GYN at [facility] tomorrow, but is concerned that she is not able to control her pain with Tylenol or Motrin.        Review of Systems   Constitutional symptoms:  Negative except as documented in HPI.   Skin symptoms:  draining left vulvar abscess.   Respiratory symptoms:  Negative except as documented in HPI.   Gastrointestinal symptoms:  Negative except as documented in HPI.   Musculoskeletal symptoms:  Negative except as documented in HPI.   Neurologic symptoms:  Negative except as documented in HPI.   Psychiatric symptoms:  Negative except as documented in HPI.             Additional review of systems information: All other systems reviewed and otherwise negative.      Health Status   Allergies:    Allergic Reactions (Selected)  No Known Medication Allergies.   Medications:  (Selected)   Inpatient Medications  Ordered  cefTRIAXone ( Rocephin ): 500 mg, IM, Once  doxycycline: 100 mg, Oral, Once  Documented Medications  Documented  fluconazole: 0 Refill(s).      Past Medical/ Family/ Social History   Medical history: Reviewed as documented in chart.   Surgical history: Reviewed as documented in chart.   Family history: Not significant.   Social history: Reviewed as documented in chart.   Problem list:    Active Problems (1)  No Chronic Problems   .  Physical Examination               Vital Signs   Vital Signs   01/05/2021 21:07 EDT Systolic Blood Pressure 139 mmHg    Diastolic Blood Pressure 113 mmHg  >HHI    Temperature Oral 36 degC    Heart Rate Monitored 98 bpm    Respiratory Rate 16 br/min    SpO2 96 %   .   Measurements   01/05/2021 21:12 EDT Body Mass Index est meas 33.08 kg/m2    Body Mass Index Measured 33.08 kg/m2   01/05/2021 21:07 EDT Height/Length Measured 175 cm    Weight Dosing 101.3 kg   .   Basic Oxygen Information    01/05/2021 21:07 EDT Oxygen Therapy Room air    SpO2 96 %   .   General:  Alert, no acute distress.    Head:  Normocephalic, atraumatic.    Respiratory:  Respirations are non-labored.   Genitourinary:  External genitalia: Left, vulva, tenderness, abscess, fluctuance, draining .   Musculoskeletal:  No swelling, no deformity.    Neurological:  Normal motor observed, normal speech observed.    Psychiatric:  Cooperative, appropriate mood & affect.       Medical Decision Making   Differential Diagnosis::  Abscess, cellulitis, hidradenitis suppurativa, sebaceous cyst.    Documents reviewed:  Emergency department nurses' notes.   Notes:  IM Rocephin administered and we will start Doxycycline.  Patient denies possible pregnancy.  Wound culture performed of drainage abscess.  GC/Chlamydia and wet prep obtained.  Patient to f/u with her GYN tomorrow as scheduled.      Reexamination/ Reevaluation   Vital signs   Basic Oxygen Information   01/05/2021 21:07 EDT Oxygen Therapy Room air    SpO2 96 %         Impression and Plan   Diagnosis   Vulvar abscess (ICD10-CM N76.4, Discharge, Medical)   Plan   Condition: Stable.    Disposition: Medically cleared, Discharged: Time  01/06/2021 00:33:00, to home.    Prescriptions: Launch prescriptions   Pharmacy:  Ultram 50 mg oral tablet (Prescribe): 50 mg, 1 tabs, Oral, q6hr, for 5 days, PRN: moderate pain (4-7), 15 tabs, 0 Refill(s)  doxycycline monohydrate 100 mg oral tablet (Prescribe): 100 mg, 1 tabs, Oral, BID, for 10 days, 20 tabs, 0 Refill(s).    Patient was given the following educational materials: Skin Abscess.    Follow up with: Follow-up with your GYN at [facility] today as scheduled Return to ED if symptoms worsen.    Counseled: Patient, Regarding diagnosis, Regarding treatment plan, Regarding prescription, Patient indicated understanding of instructions.

## 2021-01-06 NOTE — ED Provider Notes (Signed)
Abscess - simple        Patient:   Connie Barker, Connie Barker             MRN: [id number]            FIN: [id number]               Age:   25 years     Sex:  Female     DOB:  December 10, 1995   Associated Diagnoses:   Vulvar abscess   Author:   Michela Pitcher      Basic Information   Additional information: Chief Complaint from Nursing Triage Note   Chief Complaint  Chief Complaint: Pt complains of pain in vulva/clitoral area starting monday into tuesday. Pt prescribed fluconazole by telehealth yesterday. Pt states pain feels sharp and has gotten worse. (01/05/21 21:07:00).      History of Present Illness   The patient presents with abscess.  The onset was 2  days ago.  The course/duration of symptoms is constant.  Location: Left genitalia. The character of symptoms is pain, redness, swelling and drainage.  The degree of symptoms is moderate.  Prior episodes: none.  Therapy today: none.  Patient developed vaginal pain 2 days ago with increased swelling.  The area started draining today.  She has an appointment to see her GYN at [facility] tomorrow, but is concerned that she is not able to control her pain with Tylenol or Motrin.        Review of Systems   Constitutional symptoms:  Negative except as documented in HPI.   Skin symptoms:  draining left vulvar abscess.   Respiratory symptoms:  Negative except as documented in HPI.   Gastrointestinal symptoms:  Negative except as documented in HPI.   Musculoskeletal symptoms:  Negative except as documented in HPI.   Neurologic symptoms:  Negative except as documented in HPI.   Psychiatric symptoms:  Negative except as documented in HPI.             Additional review of systems information: All other systems reviewed and otherwise negative.      Health Status   Allergies:    Allergic Reactions (Selected)  No Known Medication Allergies.   Medications:  (Selected)   Inpatient Medications  Ordered  cefTRIAXone ( Rocephin ): 500 mg, IM, Once  doxycycline: 100 mg, Oral, Once  Documented  Medications  Documented  fluconazole: 0 Refill(s).      Past Medical/ Family/ Social History   Medical history: Reviewed as documented in chart.   Surgical history: Reviewed as documented in chart.   Family history: Not significant.   Social history: Reviewed as documented in chart.   Problem list:    Active Problems (1)  No Chronic Problems   .      Physical Examination               Vital Signs   Vital Signs   01/05/2021 21:07 EDT Systolic Blood Pressure 139 mmHg    Diastolic Blood Pressure 113 mmHg  >HHI    Temperature Oral 36 degC    Heart Rate Monitored 98 bpm    Respiratory Rate 16 br/min    SpO2 96 %   .   Measurements   01/05/2021 21:12 EDT Body Mass Index est meas 33.08 kg/m2    Body Mass Index Measured 33.08 kg/m2   01/05/2021 21:07 EDT Height/Length Measured 175 cm    Weight Dosing 101.3 kg   .   Basic Oxygen  Information   01/05/2021 21:07 EDT Oxygen Therapy Room air    SpO2 96 %   .   General:  Alert, no acute distress.    Head:  Normocephalic, atraumatic.    Respiratory:  Respirations are non-labored.   Genitourinary:  External genitalia: Left, vulva, tenderness, abscess, fluctuance, draining .   Musculoskeletal:  No swelling, no deformity.    Neurological:  Normal motor observed, normal speech observed.    Psychiatric:  Cooperative, appropriate mood & affect.       Medical Decision Making   Differential Diagnosis::  Abscess, cellulitis, hidradenitis suppurativa, sebaceous cyst.    Documents reviewed:  Emergency department nurses' notes.   Notes:  IM Rocephin administered and we will start Doxycycline.  Patient denies possible pregnancy.  Wound culture performed of drainage abscess.  GC/Chlamydia and wet prep obtained.  Patient to f/u with her GYN tomorrow as scheduled.      Reexamination/ Reevaluation   Vital signs   Basic Oxygen Information   01/05/2021 21:07 EDT Oxygen Therapy Room air    SpO2 96 %         Impression and Plan   Diagnosis   Vulvar abscess (ICD10-CM N76.4, Discharge, Medical)   Plan    Condition: Stable.    Disposition: Medically cleared, Discharged: Time  01/06/2021 00:33:00, to home.    Prescriptions: Launch prescriptions   Pharmacy:  Ultram 50 mg oral tablet (Prescribe): 50 mg, 1 tabs, Oral, q6hr, for 5 days, PRN: moderate pain (4-7), 15 tabs, 0 Refill(s)  doxycycline monohydrate 100 mg oral tablet (Prescribe): 100 mg, 1 tabs, Oral, BID, for 10 days, 20 tabs, 0 Refill(s).    Patient was given the following educational materials: Skin Abscess.    Follow up with: Follow-up with your GYN at [facility] today as scheduled Return to ED if symptoms worsen.    Counseled: Patient, Regarding diagnosis, Regarding treatment plan, Regarding prescription, Patient indicated understanding of instructions.      Signature Line     Electronically Signed on 01/06/2021 12:35 AM EDT   ________________________________________________   Michela Pitcher      Electronically Signed on 01/06/2021 06:30 AM EDT   ________________________________________________   Henrene Pastor  Wade-MD            Modified by: Michela Pitcher on 01/06/2021 12:31 AM EDT      Modified by: Michela Pitcher on 01/06/2021 12:35 AM EDT

## 2021-01-06 NOTE — ED Notes (Signed)
ED Patient Education Note     Patient Education Materials Follows:  Infectious Disease     Skin Abscess      A skin abscess is an infected area on or under your skin that contains a collection of pus and other material. An abscess may also be called a furuncle, carbuncle, or boil. An abscess can occur in or on almost any part of your body.    Some abscesses break open (rupture) on their own. Most continue to get worse unless they are treated. The infection can spread deeper into the body and eventually into your blood, which can make you feel ill. Treatment usually involves draining the abscess.      What are the causes?    An abscess occurs when germs, like bacteria, pass through your skin and cause an infection. This may be caused by:   A scrape or cut on your skin.     A puncture wound through your skin, including a needle injection or insect bite.     Blocked oil or sweat glands.     Blocked and infected hair follicles.     A cyst that forms beneath your skin (sebaceous cyst) and becomes infected.        What increases the risk?    This condition is more likely to develop in people who:   Have a weak body defense system (immune system).     Have diabetes.     Have dry and irritated skin.     Get frequent injections or use illegal IV drugs.     Have a foreign body in a wound, such as a splinter.     Have problems with their lymph system or veins.        What are the signs or symptoms?    Symptoms of this condition include:   A painful, firm bump under the skin.     A bump with pus at the top. This may break through the skin and drain.      Other symptoms include:   Redness surrounding the abscess site.     Warmth.     Swelling of the lymph nodes (glands) near the abscess.     Tenderness.     A sore on the skin.        How is this diagnosed?    This condition may be diagnosed based on:   A physical exam.     Your medical history.     A sample of pus. This may be used to find out what is causing the infection.      Blood tests.     Imaging tests, such as an ultrasound, CT scan, or MRI.        How is this treated?    A small abscess that drains on its own may not need treatment. Treatment for larger abscesses may include:   Moist heat or heat pack applied to the area several times a day.     A procedure to drain the abscess (incision and drainage).     Antibiotic medicines. For a severe abscess, you may first get antibiotics through an IV and then change to antibiotics by mouth.        Follow these instructions at home:    Medicines       Take over-the-counter and prescription medicines only as told by your health care provider.     If you were prescribed an antibiotic medicine, take it as   told by your health care provider. Do not stop taking the antibiotic even if you start to feel better.      Abscess care       If you have an abscess that has not drained, apply heat to the affected area. Use the heat source that your health care provider recommends, such as a moist heat pack or a heating pad.  ? Place a towel between your skin and the heat source.    ? Leave the heat on for 20?30 minutes.    ? Remove the heat if your skin turns bright red. This is especially important if you are unable to feel pain, heat, or cold. You may have a greater risk of getting burned.       Follow instructions from your health care provider about how to take care of your abscess. Make sure you:  ? Cover the abscess with a bandage (dressing).    ? Change your dressing or gauze as told by your health care provider.    ? Wash your hands with soap and water before you change the dressing or gauze. If soap and water are not available, use hand sanitizer.       Check your abscess every day for signs of a worsening infection. Check for:  ? More redness, swelling, or pain.    ? More fluid or blood.    ? Warmth.    ? More pus or a bad smell.        General instructions     To avoid spreading the infection:  ? Do not share personal care items, towels, or hot  tubs with others.    ? Avoid making skin contact with other people.       Keep all follow-up visits as told by your health care provider. This is important.        Contact a health care provider if you have:     More redness, swelling, or pain around your abscess.     More fluid or blood coming from your abscess.     Warm skin around your abscess.     More pus or a bad smell coming from your abscess.     A fever.     Muscle aches.     Chills or a general ill feeling.      Get help right away if you:     Have severe pain.     See red streaks on your skin spreading away from the abscess.      Summary     A skin abscess is an infected area on or under your skin that contains a collection of pus and other material.     A small abscess that drains on its own may not need treatment.     Treatment for larger abscesses may include having a procedure to drain the abscess and taking an antibiotic.      This information is not intended to replace advice given to you by your health care provider. Make sure you discuss any questions you have with your health care provider.      Document Revised: 06/13/2018 Document Reviewed: 04/05/2017  [employer] Patient Education ? 2021 [employer]

## 2021-01-06 NOTE — ED Notes (Signed)
ED Triage Note       ED Secondary Triage Entered On:  01/06/2021 1:05 EDT    Performed On:  01/06/2021 1:05 EDT by Balinda Quails               General Information   Barriers to Learning :   None evident   ED Home Meds Section :   Document assessment   [facility] ED Fall Risk Section :   Document assessment   ED Advance Directives Section :   Document assessment   ED Palliative Screen :   N/A (prefilled for <27yo)   Mosco,  Olivia-RN - 01/06/2021 1:05 EDT   (As Of: 01/06/2021 01:05:41 EDT)   Problems(Active)    No Chronic Problems (Cerner  :NKP )  Name of Problem:   No Chronic Problems ; Recorder:   Hardie Lora, RN, Kristeen Miss; Code:   NKP ; Last Updated:   01/05/2021 21:10 EDT ; Life Cycle Date:   01/05/2021 ; Life Cycle Status:   Active ; Vocabulary:   Cerner          Diagnoses(Active)    Abscess - simple  Date:   01/06/2021 ; Diagnosis Type:   Reason For Visit ; Confirmation:   Confirmed ; Clinical Dx:   Abscess - simple ; Classification:   Medical ; Clinical Service:   Emergency medicine ; Code:   PNED ; Probability:   0 ; Diagnosis Code:   [id number]      Vaginal pain-external  Date:   01/05/2021 ; Diagnosis Type:   Reason For Visit ; Confirmation:   Complaint of ; Clinical Dx:   Vaginal pain-external ; Classification:   Medical ; Clinical Service:   Non-Specified ; Code:   PNED ; Probability:   0 ; Diagnosis Code:   [id number]      Vulvar abscess  Date:   01/06/2021 ; Diagnosis Type:   Discharge ; Confirmation:   Confirmed ; Clinical Dx:   Vulvar abscess ; Classification:   Medical ; Clinical Service:   Non-Specified ; Code:   ICD-10-CM ; Probability:   0 ; Diagnosis Code:   N76.4             -    Procedure History   (As Of: 01/06/2021 01:05:41 EDT)     [facility] Fall Risk Assessment Tool   Hx of falling last 3 months ED Fall :   No   Patient confused or disoriented ED Fall :   No   Patient intoxicated or sedated ED Fall :   No   Patient impaired gait ED Fall :   No   Use a  mobility assistance device ED Fall :   No   Patient altered elimination ED Fall :   No   [facility] ED Fall Score :   0    Mosco,  Olivia-RN - 01/06/2021 1:05 EDT   ED Advance Directive   Advance Directive :   No   Mosco,  Olivia-RN - 01/06/2021 1:05 EDT

## 2021-01-07 LAB — C.TRACHOMATIS N.GONORRHOEAE DNA
Chlamydia trachomatis, NAA: NEGATIVE
Neisseria Gonorrhoeae, NAA: NEGATIVE

## 2021-01-07 LAB — CULTURE, WOUND: FINAL REPORT: NORMAL
